# Patient Record
Sex: Female | Born: 1995 | Race: White | Hispanic: No | Marital: Married | Smoking: Never smoker
Health system: Southern US, Community
[De-identification: ages and names within clinical notes are randomized; demographics above are authoritative.]

## PROBLEM LIST (undated history)

## (undated) ENCOUNTER — Inpatient Hospital Stay (HOSPITAL_COMMUNITY): Payer: Self-pay

## (undated) DIAGNOSIS — K5900 Constipation, unspecified: Secondary | ICD-10-CM

## (undated) DIAGNOSIS — F319 Bipolar disorder, unspecified: Secondary | ICD-10-CM

## (undated) DIAGNOSIS — K529 Noninfective gastroenteritis and colitis, unspecified: Secondary | ICD-10-CM

## (undated) DIAGNOSIS — N83209 Unspecified ovarian cyst, unspecified side: Secondary | ICD-10-CM

## (undated) DIAGNOSIS — Z789 Other specified health status: Secondary | ICD-10-CM

## (undated) HISTORY — PX: TONSILLECTOMY: SUR1361

## (undated) HISTORY — DX: Unspecified ovarian cyst, unspecified side: N83.209

## (undated) HISTORY — DX: Noninfective gastroenteritis and colitis, unspecified: K52.9

---
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
## 2015-02-28 ENCOUNTER — Encounter (HOSPITAL_COMMUNITY): Payer: Self-pay | Admitting: *Deleted

## 2016-06-20 IMAGING — CR DG ABDOMEN 1V
1 series · 1 of 1 positions shown · non-contrast
Comparison: 11/04/2011

CLINICAL DATA: Constipation for 2 weeks. Nausea, vomiting, and
abdominal pain for 1 day.

EXAM:
ABDOMEN - 1 VIEW

[t abdomen supine]
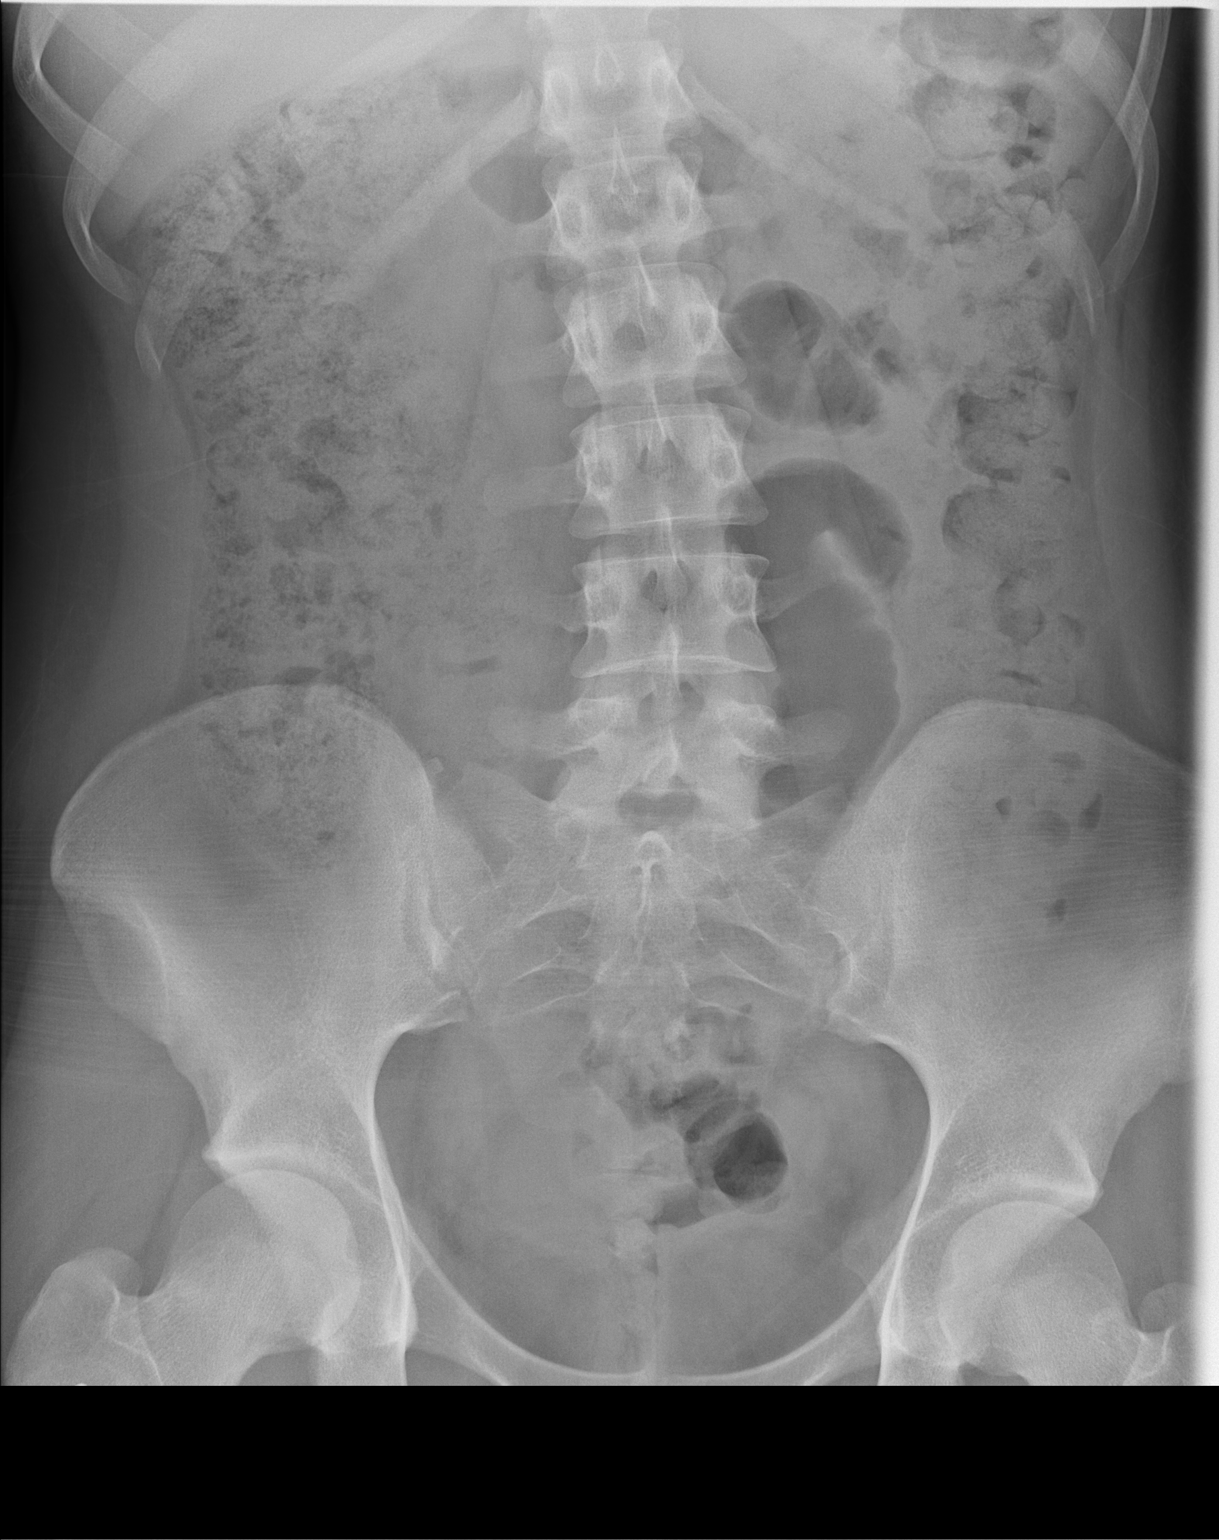

[1 of 1 positions shown; findings below may reference images not displayed]

FINDINGS: Diffusely stool-filled colon. No small or large bowel distention. No
radiopaque stones. Visualized bones appear intact.
IMPRESSION: Nonobstructive bowel gas pattern with stool-filled colon.

## 2017-05-30 ENCOUNTER — Encounter (HOSPITAL_COMMUNITY): Payer: Self-pay | Admitting: *Deleted

## 2017-05-30 ENCOUNTER — Inpatient Hospital Stay (HOSPITAL_COMMUNITY)
Admission: AD | Admit: 2017-05-30 | Discharge: 2017-05-30 | Disposition: A | Discharge status: Home / Self Care | Source: Ambulatory Visit | Attending: Obstetrics and Gynecology | Admitting: Obstetrics and Gynecology

## 2017-05-30 ENCOUNTER — Other Ambulatory Visit: Payer: Self-pay

## 2017-05-30 DIAGNOSIS — N912 Amenorrhea, unspecified: Secondary | ICD-10-CM | POA: Insufficient documentation

## 2017-05-30 DIAGNOSIS — Z711 Person with feared health complaint in whom no diagnosis is made: Secondary | ICD-10-CM | POA: Diagnosis not present

## 2017-05-30 DIAGNOSIS — Z3490 Encounter for supervision of normal pregnancy, unspecified, unspecified trimester: Secondary | ICD-10-CM

## 2017-05-30 NOTE — [facility] Provider Note (Signed)
[name] is a 22 y.o. G1P0000 who present to [facility] today for pregnancy confirmation. She denies abdominal pain or vaginal bleeding.   BP 116/61 (BP Location: Right Arm)   Pulse 80   Temp 98.5 F (36.9 C) (Oral)   Resp 16   Wt 137 lb 4 oz (62.3 kg)   LMP 04/27/2017   SpO2 100%   BMI 22.84 kg/m  CONSTITUTIONAL: Well-developed, well-nourished female in no acute distress.  CARDIOVASCULAR: Normal heart rate noted RESPIRATORY: Effort and breath sounds normal GASTROINTESTINAL:Soft, no distention noted.  No tenderness, rebound or guarding.  SKIN: Skin is warm and dry. No rash noted. Not diaphoretic. No erythema. No pallor. PSYCHIATRIC: Normal mood and affect. Normal behavior. Normal judgment and thought content.  MDM Medical screening exam complete Patient does not endorse any symptoms concerning for ectopic pregnancy or pregnancy related complication today.   A:  Amenorrhea  P: Discharge home Patient advised that she can present as a walk-in to [facility] for a pregnancy test M-Th between 8am-4pm or Friday between 8am -11am Reasons to return to [facility]U reviewed  Patient may return to [facility]U as needed or if her condition were to change or worsen  Montez Morita, CNM 05/30/2017 12:27 PM

## 2017-05-30 NOTE — MAU Note (Signed)
Just here to confirm preg.  3+HPT. Had some cramping yesterday, none today.

## 2017-07-02 ENCOUNTER — Encounter (HOSPITAL_COMMUNITY): Payer: Self-pay

## 2017-07-02 ENCOUNTER — Other Ambulatory Visit: Payer: Self-pay

## 2017-07-02 ENCOUNTER — Inpatient Hospital Stay (HOSPITAL_COMMUNITY)

## 2017-07-02 ENCOUNTER — Inpatient Hospital Stay (HOSPITAL_COMMUNITY)
Admission: AD | Admit: 2017-07-02 | Discharge: 2017-07-02 | Disposition: A | Discharge status: Home / Self Care | Attending: Obstetrics and Gynecology | Admitting: Obstetrics and Gynecology

## 2017-07-02 DIAGNOSIS — Z3A09 9 weeks gestation of pregnancy: Secondary | ICD-10-CM | POA: Insufficient documentation

## 2017-07-02 DIAGNOSIS — O219 Vomiting of pregnancy, unspecified: Secondary | ICD-10-CM

## 2017-07-02 DIAGNOSIS — O26891 Other specified pregnancy related conditions, first trimester: Secondary | ICD-10-CM | POA: Insufficient documentation

## 2017-07-02 DIAGNOSIS — O99341 Other mental disorders complicating pregnancy, first trimester: Secondary | ICD-10-CM | POA: Insufficient documentation

## 2017-07-02 DIAGNOSIS — R1033 Periumbilical pain: Secondary | ICD-10-CM | POA: Insufficient documentation

## 2017-07-02 DIAGNOSIS — R109 Unspecified abdominal pain: Secondary | ICD-10-CM | POA: Diagnosis not present

## 2017-07-02 DIAGNOSIS — F319 Bipolar disorder, unspecified: Secondary | ICD-10-CM | POA: Diagnosis not present

## 2017-07-02 DIAGNOSIS — O26899 Other specified pregnancy related conditions, unspecified trimester: Secondary | ICD-10-CM

## 2017-07-02 LAB — URINALYSIS, ROUTINE W REFLEX MICROSCOPIC
Bilirubin Urine: NEGATIVE
Glucose, UA: NEGATIVE mg/dL
HGB URINE DIPSTICK: NEGATIVE
Ketones, ur: NEGATIVE mg/dL
NITRITE: NEGATIVE
PROTEIN: NEGATIVE mg/dL
SPECIFIC GRAVITY, URINE: 1.011 (ref 1.005–1.030)
pH: 7 (ref 5.0–8.0)

## 2017-07-02 LAB — CBC
HEMATOCRIT: 36.5 % (ref 36.0–46.0)
Hemoglobin: 12.2 g/dL (ref 12.0–15.0)
MCH: 29.4 pg (ref 26.0–34.0)
MCHC: 33.4 g/dL (ref 30.0–36.0)
MCV: 88 fL (ref 78.0–100.0)
Platelets: 283 10*3/uL (ref 150–400)
RBC: 4.15 MIL/uL (ref 3.87–5.11)
RDW: 13.2 % (ref 11.5–15.5)
WBC: 11.5 10*3/uL — ABNORMAL HIGH (ref 4.0–10.5)

## 2017-07-02 LAB — WET PREP, GENITAL
Clue Cells Wet Prep HPF POC: NONE SEEN
Sperm: NONE SEEN
TRICH WET PREP: NONE SEEN
YEAST WET PREP: NONE SEEN

## 2017-07-02 LAB — RAPID URINE DRUG SCREEN, HOSP PERFORMED
Amphetamines: NOT DETECTED
Benzodiazepines: NOT DETECTED
Cocaine: NOT DETECTED
OPIATES: NOT DETECTED
TETRAHYDROCANNABINOL: NOT DETECTED

## 2017-07-02 LAB — HCG, QUANTITATIVE, PREGNANCY: HCG, BETA CHAIN, QUANT, S: 142195 m[IU]/mL — AB (ref <5)

## 2017-07-02 LAB — POCT PREGNANCY, URINE: Preg Test, Ur: POSITIVE — AB

## 2017-07-02 MED ORDER — ONDANSETRON 4 MG PO TBDP
4.0000 mg | ORAL_TABLET | Freq: Once | ORAL | Status: AC
  Administered 2017-07-02: 4 mg via ORAL
  Filled 2017-07-02: qty 1

## 2017-07-02 MED ORDER — DOXYLAMINE-PYRIDOXINE 10-10 MG PO TBEC: 2.0000 | DELAYED_RELEASE_TABLET | Freq: Every evening | ORAL | 2 refills | Status: DC | PRN

## 2017-07-02 MED ORDER — ONDANSETRON 4 MG PO TBDP: 4.0000 mg | ORAL_TABLET | Freq: Three times a day (TID) | ORAL | 0 refills | Status: DC | PRN

## 2017-07-02 NOTE — Discharge Instructions (Signed)
Abdominal Pain During Pregnancy °Abdominal pain is common in pregnancy. Most of the time, it does not cause harm. There are many causes of abdominal pain. Some causes are more serious than others and sometimes the cause is not known. Abdominal pain can be a sign that something is very wrong with the pregnancy or the pain may have nothing to do with the pregnancy. Always tell your health care provider if you have any abdominal pain. °Follow these instructions at home: °· Do not have sex or put anything in your vagina until your symptoms go away completely. °· Watch your abdominal pain for any changes. °· Get plenty of rest until your pain improves. °· Drink enough fluid to keep your urine clear or pale yellow. °· Take over-the-counter or prescription medicines only as told by your health care provider. °· Keep all follow-up visits as told by your health care provider. This is important. °Contact a health care provider if: °· You have a fever. °· Your pain gets worse or you have cramping. °· Your pain continues after resting. °Get help right away if: °· You are bleeding, leaking fluid, or passing tissue from the vagina. °· You have vomiting or diarrhea that does not go away. °· You have painful or bloody urination. °· You notice a decrease in your baby's movements. °· You feel very weak or faint. °· You have shortness of breath. °· You develop a severe headache with abdominal pain. °· You have abnormal vaginal discharge with abdominal pain. °This information is not intended to replace advice given to you by your health care provider. Make sure you discuss any questions you have with your health care provider. °Document Released: 12/23/2004 Document Revised: 10/04/2015 Document Reviewed: 07/22/2012 °Elsevier Interactive Patient Education © 2018 Elsevier Inc. ° °Morning Sickness °Morning sickness is when you feel sick to your stomach (nauseous) during pregnancy. This nauseous feeling may or may not come with vomiting.  It often occurs in the morning but can be a problem any time of day. Morning sickness is most common during the first trimester, but it may continue throughout pregnancy. While morning sickness is unpleasant, it is usually harmless unless you develop severe and continual vomiting (hyperemesis gravidarum). This condition requires more intense treatment. °What are the causes? °The cause of morning sickness is not completely known but seems to be related to normal hormonal changes that occur in pregnancy. °What increases the risk? °You are at greater risk if you: °· Experienced nausea or vomiting before your pregnancy. °· Had morning sickness during a previous pregnancy. °· Are pregnant with more than one baby, such as twins. ° °How is this treated? °Do not use any medicines (prescription, over-the-counter, or herbal) for morning sickness without first talking to your health care provider. Your health care provider may prescribe or recommend: °· Vitamin B6 supplements. °· Anti-nausea medicines. °· The herbal medicine ginger. ° °Follow these instructions at home: °· Only take over-the-counter or prescription medicines as directed by your health care provider. °· Taking multivitamins before getting pregnant can prevent or decrease the severity of morning sickness in most women. °· Eat a piece of dry toast or unsalted crackers before getting out of bed in the morning. °· Eat five or six small meals a day. °· Eat dry and bland foods (rice, baked potato). Foods high in carbohydrates are often helpful. °· Do not drink liquids with your meals. Drink liquids between meals. °· Avoid greasy, fatty, and spicy foods. °· Get someone to cook for   you if the smell of any food causes nausea and vomiting.  If you feel nauseous after taking prenatal vitamins, take the vitamins at night or with a snack.  Snack on protein foods (nuts, yogurt, cheese) between meals if you are hungry.  Eat unsweetened gelatins for desserts.  Wearing  an acupressure wristband (worn for sea sickness) may be helpful.  Acupuncture may be helpful.  Do not smoke.  Get a humidifier to keep the air in your house free of odors.  Get plenty of fresh air. Contact a health care provider if:  Your home remedies are not working, and you need medicine.  You feel dizzy or lightheaded.  You are losing weight. Get help right away if:  You have persistent and uncontrolled nausea and vomiting.  You pass out (faint). This information is not intended to replace advice given to you by your health care provider. Make sure you discuss any questions you have with your health care provider. Document Released: 02/13/2006 Document Revised: 05/31/2015 Document Reviewed: 06/09/2012 Elsevier Interactive Patient Education  2017 Elsevier Inc.  [place] [facility] Providers    [facility]       Phone: [phone]  [facility] [place] Phone: [phone]  [facility] [place]  Phone: [phone]  [facility]  Phone: [phone]  [facility] [place]  Phone: [phone]  [facility]       Phone: [phone]  [facility]    Phone: [phone]   [facility]    Phone: [phone]  [facility]    Phone: [phone]  [facility]    Phone: [phone]  [facility]    Phone: [phone]  [facility]       Phone: [phone]   [facility]  Phone: [phone]  [facility]    Phone: [phone]  [facility]   Phone: [phone]  [facility]      Phone: [phone]  [facility]    Phone: [phone]

## 2017-07-02 NOTE — MAU Provider Note (Signed)
Chief Complaint: Abdominal Pain   First Provider Initiated Contact with Patient 07/02/17 2044        SUBJECTIVE HPI: Kimberly [name] is a 22 y.o. G3P1001 at [redacted]w[redacted]d by LMP who presents to maternity admissions reporting lower abdominal pain for several hours.  Has also had nausea and vomiting. Several + UPTs at home.  . She denies vaginal bleeding, vaginal itching/burning, urinary symptoms, h/a, dizziness, or fever/chills.    Recently moved from [place] after husband got out of the service. Lives here now.   Abdominal Pain  This is a new problem. The current episode started today. The onset quality is gradual. The problem occurs intermittently. The problem has been unchanged. The pain is located in the periumbilical region. The quality of the pain is cramping. The abdominal pain does not radiate. Associated symptoms include nausea and vomiting. Pertinent negatives include no anorexia, constipation, diarrhea, dysuria or fever. Nothing aggravates the pain. The pain is relieved by nothing. She has tried nothing for the symptoms.   RN Note: Pt states that she is starting having abdominal pain around 1200. Pt states she also having nausea and vomiting.  Pt states she also having a headache. Rating 8/10 intermittent     Past Medical History:  Diagnosis Date  . Bipolar 1 disorder (HCC)   . Constipation   . Enteritis   . No pertinent past medical history   . Ovarian cyst    Past Surgical History:  Procedure Laterality Date  . TONSILLECTOMY     Social History   Socioeconomic History  . Marital status: Married    Spouse name: Not on file  . Number of children: Not on file  . Years of education: Not on file  . Highest education level: Not on file  Occupational History  . Not on file  Social Needs  . Financial resource strain: Not on file  . Food insecurity:    Worry: Not on file    Inability: Not on file  . Transportation needs:    Medical: Not on file    Non-medical: Not on file   Tobacco Use  . Smoking status: Never Smoker  . Smokeless tobacco: Never Used  Substance and Sexual Activity  . Alcohol use: No  . Drug use: No  . Sexual activity: Yes    Birth control/protection: None  Lifestyle  . Physical activity:    Days per week: Not on file    Minutes per session: Not on file  . Stress: Not on file  Relationships  . Social connections:    Talks on phone: Not on file    Gets together: Not on file    Attends religious service: Not on file    Active member of club or organization: Not on file    Attends meetings of clubs or organizations: Not on file    Relationship status: Not on file  . Intimate partner violence:    Fear of current or ex partner: Not on file    Emotionally abused: Not on file    Physically abused: Not on file    Forced sexual activity: Not on file  Other Topics Concern  . Not on file  Social History Narrative   10th grade   No current facility-administered medications on file prior to encounter.    Current Outpatient Medications on File Prior to Encounter  Medication Sig Dispense Refill  . hydrocortisone-pramoxine (PROCTOFOAM HC) rectal foam Place 1 applicator rectally 2 (two) times daily. 10 g 1  .  Prenatal Vit-Fe Fumarate-FA (PRENATAL MULTIVITAMIN) TABS tablet Take 1 tablet by mouth daily at 12 noon.     No Known Allergies  I have reviewed patient's Past Medical Hx, Surgical Hx, Family Hx, Social Hx, medications and allergies.   ROS:  Review of Systems  Constitutional: Negative for fever.  Gastrointestinal: Positive for abdominal pain, nausea and vomiting. Negative for anorexia, constipation and diarrhea.  Genitourinary: Negative for dysuria.   Review of Systems  Other systems negative   Physical Exam  Physical Exam Patient Vitals for the past 24 hrs:  BP Temp Pulse Resp SpO2 Weight  07/02/17 2016 108/73 98.1 F (36.7 C) 77 16 100 % -  07/02/17 2009 - - - - - 145 lb 3 oz (65.9 kg)   Constitutional: Well-developed,  well-nourished female in no acute distress.  Cardiovascular: normal rate Respiratory: normal effort GI: Abd soft, non-tender. Pos BS x 4 MS: Extremities nontender, no edema, normal ROM Neurologic: Alert and oriented x 4.  GU: Neg CVAT.  PELVIC EXAM: Cervix pink, visually closed, without lesion, scant white creamy discharge, vaginal walls and external genitalia normal Bimanual exam: Cervix 0/long/high, firm, anterior, neg CMT, uterus nontender, nonenlarged, adnexa without tenderness, enlargement, or mass   LAB RESULTS Results for orders placed or performed during the hospital encounter of 07/02/17 (from the past 24 hour(s))  Urinalysis, Routine w reflex microscopic     Status: Abnormal   Collection Time: 07/02/17  8:11 PM  Result Value Ref Range   Color, Urine YELLOW YELLOW   APPearance HAZY (A) CLEAR   Specific Gravity, Urine 1.011 1.005 - 1.030   pH 7.0 5.0 - 8.0   Glucose, UA NEGATIVE NEGATIVE mg/dL   Hgb urine dipstick NEGATIVE NEGATIVE   Bilirubin Urine NEGATIVE NEGATIVE   Ketones, ur NEGATIVE NEGATIVE mg/dL   Protein, ur NEGATIVE NEGATIVE mg/dL   Nitrite NEGATIVE NEGATIVE   Leukocytes, UA TRACE (A) NEGATIVE   RBC / HPF 0-5 0 - 5 RBC/hpf   WBC, UA 0-5 0 - 5 WBC/hpf   Bacteria, UA RARE (A) NONE SEEN   Squamous Epithelial / LPF 0-5 0 - 5  Pregnancy, urine POC     Status: Abnormal   Collection Time: 07/02/17  8:36 PM  Result Value Ref Range   Preg Test, Ur POSITIVE (A) NEGATIVE  Urine rapid drug screen (hosp performed)     Status: Abnormal   Collection Time: 07/02/17  8:39 PM  Result Value Ref Range   Opiates NONE DETECTED NONE DETECTED   Cocaine NONE DETECTED NONE DETECTED   Benzodiazepines NONE DETECTED NONE DETECTED   Amphetamines NONE DETECTED NONE DETECTED   Tetrahydrocannabinol NONE DETECTED NONE DETECTED   Barbiturates (A) NONE DETECTED    Result not available. Reagent lot number recalled by manufacturer.  CBC     Status: Abnormal   Collection Time: 07/02/17   8:56 PM  Result Value Ref Range   WBC 11.5 (H) 4.0 - 10.5 K/uL   RBC 4.15 3.87 - 5.11 MIL/uL   Hemoglobin 12.2 12.0 - 15.0 g/dL   HCT 16.1 09.6 - 04.5 %   MCV 88.0 78.0 - 100.0 fL   MCH 29.4 26.0 - 34.0 pg   MCHC 33.4 30.0 - 36.0 g/dL   RDW 40.9 81.1 - 91.4 %   Platelets 283 150 - 400 K/uL  hCG, quantitative, pregnancy     Status: Abnormal   Collection Time: 07/02/17  8:56 PM  Result Value Ref Range   hCG, Beta Chain, Quant, S  142,195 (H) <5 mIU/mL  Wet prep, genital     Status: Abnormal   Collection Time: 07/02/17  9:11 PM  Result Value Ref Range   Yeast Wet Prep HPF POC NONE SEEN NONE SEEN   Trich, Wet Prep NONE SEEN NONE SEEN   Clue Cells Wet Prep HPF POC NONE SEEN NONE SEEN   WBC, Wet Prep HPF POC FEW (A) NONE SEEN   Sperm NONE SEEN       IMAGING [place] Ob Comp Less 14 Wks  Result Date: 07/02/2017 CLINICAL DATA:  22 year old pregnant female presenting with abdominal pain. LMP: 04/27/2017 corresponding to an estimated gestational age of [redacted] weeks, 3 days EXAM: OBSTETRIC <14 WK ULTRASOUND TECHNIQUE: Transabdominal ultrasound was performed for evaluation of the gestation as well as the maternal uterus and adnexal regions. COMPARISON:  None. FINDINGS: Intrauterine gestational sac: Single intrauterine gestational sac. Yolk sac:  Seen Embryo:  Present Cardiac Activity: Detected Heart Rate: 170 bpm CRL:   27 mm   9 w 4 d                  US EDC: 01/31/2018 Subchorionic hemorrhage: Small subchorionic hemorrhage measuring 2.2 x 3.6 x 1.2 cm. Maternal uterus/adnexae: The maternal ovaries are unremarkable. There is a 4.9 x 3.0 x 3.8 cm corpus luteum cyst in the right ovary. IMPRESSION: 1. Single live intrauterine pregnancy with an estimated gestational age of [redacted] weeks, 4 days based on today's ultrasound, concordant with age based on LMP. 2. Small subchorionic hemorrhage. Electronically Signed   By: Elgie CollardArash  Radparvar M.D.   On: 07/02/2017 22:17     MAU Management/MDM: Ordered usual first trimester  r/o ectopic labs.   Pelvic exam and cultures done Will check baseline Ultrasound to rule out ectopic.  This bleeding/pain can represent a normal pregnancy with bleeding, spontaneous abortion or even an ectopic which can be life-threatening.  The process as listed above helps to determine which of these is present.  Reviewed findings with patient.  Live IUP at [id number] List of providers given for prenatal care Zofran given for vomiting Will rx Diclegis with Zofran for backup  ASSESSMENT Single IUP at 4670w4d Periumbilical pain, uncertain etiology Pregnancy of unknown location >> deemed to be intrauterine Nausea and vomiting  PLAN Discharge home List of OB providers given Rx Diclegis for N/V Rx Zofran as backup if Diclegis fails [facility]  Pt stable at time of discharge. Encouraged to return here or to other Urgent Care/ED if she develops worsening of symptoms, increase in pain, fever, or other concerning symptoms.    Wynelle BourgeoisMarie Zaiden Ludlum CNM, MSN Certified Nurse-Midwife 07/02/2017  8:44 PM

## 2017-07-02 NOTE — MAU Note (Signed)
Pt states that she is starting having abdominal pain around 1200.  Pt states she also having nausea and vomiting.   Pt states she also having a headache. Rating 8/10 intermittent

## 2017-07-03 ENCOUNTER — Emergency Department (HOSPITAL_COMMUNITY)
Admission: EM | Admit: 2017-07-03 | Discharge: 2017-07-03 | Disposition: A | Discharge status: Home / Self Care | Attending: Emergency Medicine | Admitting: Emergency Medicine

## 2017-07-03 ENCOUNTER — Emergency Department (HOSPITAL_COMMUNITY)

## 2017-07-03 ENCOUNTER — Other Ambulatory Visit: Payer: Self-pay

## 2017-07-03 ENCOUNTER — Encounter (HOSPITAL_COMMUNITY): Payer: Self-pay | Admitting: Emergency Medicine

## 2017-07-03 DIAGNOSIS — Z79899 Other long term (current) drug therapy: Secondary | ICD-10-CM | POA: Insufficient documentation

## 2017-07-03 DIAGNOSIS — R1013 Epigastric pain: Secondary | ICD-10-CM

## 2017-07-03 DIAGNOSIS — R112 Nausea with vomiting, unspecified: Secondary | ICD-10-CM | POA: Insufficient documentation

## 2017-07-03 DIAGNOSIS — R1115 Cyclical vomiting syndrome unrelated to migraine: Secondary | ICD-10-CM

## 2017-07-03 DIAGNOSIS — R10811 Right upper quadrant abdominal tenderness: Secondary | ICD-10-CM

## 2017-07-03 DIAGNOSIS — R109 Unspecified abdominal pain: Secondary | ICD-10-CM | POA: Diagnosis present

## 2017-07-03 LAB — CBC WITH DIFFERENTIAL/PLATELET
Abs Immature Granulocytes: 0 10*3/uL (ref 0.0–0.1)
Basophils Absolute: 0 10*3/uL (ref 0.0–0.1)
Basophils Relative: 0 %
EOS ABS: 0 10*3/uL (ref 0.0–0.7)
Eosinophils Relative: 0 %
HEMATOCRIT: 39.2 % (ref 36.0–46.0)
HEMOGLOBIN: 12.6 g/dL (ref 12.0–15.0)
Immature Granulocytes: 0 %
LYMPHS ABS: 0.9 10*3/uL (ref 0.7–4.0)
Lymphocytes Relative: 7 %
MCH: 28.9 pg (ref 26.0–34.0)
MCHC: 32.1 g/dL (ref 30.0–36.0)
MCV: 89.9 fL (ref 78.0–100.0)
Monocytes Absolute: 0.7 10*3/uL (ref 0.1–1.0)
Monocytes Relative: 6 %
NEUTROS ABS: 11.4 10*3/uL — AB (ref 1.7–7.7)
NEUTROS PCT: 87 %
Platelets: 282 10*3/uL (ref 150–400)
RBC: 4.36 MIL/uL (ref 3.87–5.11)
RDW: 12.8 % (ref 11.5–15.5)
WBC: 13.1 10*3/uL — AB (ref 4.0–10.5)

## 2017-07-03 LAB — COMPREHENSIVE METABOLIC PANEL
ALBUMIN: 3.5 g/dL (ref 3.5–5.0)
ALT: 10 U/L (ref 0–44)
ANION GAP: 8 (ref 5–15)
AST: 14 U/L — ABNORMAL LOW (ref 15–41)
Alkaline Phosphatase: 52 U/L (ref 38–126)
BUN: <5 mg/dL — ABNORMAL LOW (ref 6–20)
CALCIUM: 9 mg/dL (ref 8.9–10.3)
CHLORIDE: 105 mmol/L (ref 98–111)
CO2: 23 mmol/L (ref 22–32)
Creatinine, Ser: 0.54 mg/dL (ref 0.44–1.00)
GFR calc non Af Amer: >60 mL/min (ref >60)
GLUCOSE: 100 mg/dL — AB (ref 70–99)
Potassium: 3.6 mmol/L (ref 3.5–5.1)
SODIUM: 136 mmol/L (ref 135–145)
Total Bilirubin: 0.8 mg/dL (ref 0.3–1.2)
Total Protein: 6.6 g/dL (ref 6.5–8.1)

## 2017-07-03 LAB — HIV ANTIBODY (ROUTINE TESTING W REFLEX): HIV SCREEN 4TH GENERATION: NONREACTIVE

## 2017-07-03 LAB — GC/CHLAMYDIA PROBE AMP (~~LOC~~) NOT AT ARMC
CHLAMYDIA, DNA PROBE: POSITIVE — AB
NEISSERIA GONORRHEA: NEGATIVE

## 2017-07-03 LAB — LIPASE, BLOOD: Lipase: 24 U/L (ref 11–51)

## 2017-07-03 MED ORDER — ACETAMINOPHEN 325 MG PO TABS
650.0000 mg | ORAL_TABLET | Freq: Once | ORAL | Status: AC
  Administered 2017-07-03: 650 mg via ORAL
  Filled 2017-07-03: qty 2

## 2017-07-03 MED ORDER — SODIUM CHLORIDE 0.9 % IV BOLUS
1000.0000 mL | Freq: Once | INTRAVENOUS | Status: AC
  Administered 2017-07-03: 1000 mL via INTRAVENOUS

## 2017-07-03 MED ORDER — PYRIDOXINE HCL 25 MG PO TABS: 25.0000 mg | ORAL_TABLET | Freq: Every day | ORAL | 0 refills | Status: DC

## 2017-07-03 MED ORDER — METOCLOPRAMIDE HCL 5 MG/ML IJ SOLN
10.0000 mg | Freq: Once | INTRAMUSCULAR | Status: AC
  Administered 2017-07-03: 10 mg via INTRAVENOUS
  Filled 2017-07-03: qty 2

## 2017-07-03 NOTE — ED Notes (Signed)
Patient tolerating oral intake 

## 2017-07-03 NOTE — ED Notes (Signed)
Patient transported to Ultrasound 

## 2017-07-03 NOTE — ED Provider Notes (Signed)
[facility] EMERGENCY DEPARTMENT Provider Note   CSN: [id number] Arrival date & time: 07/03/17  4098     History   Chief Complaint Chief Complaint  Patient presents with  . Abdominal Pain    HPI Kimberly [name] is a 22 y.o. female.  HPI   Kimberly [name] is a 22 y.o. female, with a history of bipolar and G2P1, presenting to the ED with abdominal pain beginning yesterday. Pain is just above umbilicus, waxing and waning, cramping, 7/10, nonradiating. Accompanied by nausea and vomiting.   Was seen at [facility] hospital yesterday where she had labs and US performed. Discharged with prescription for diclegis and Zofran, but hasn't filled it. Was told she can take tylenol, but has not done this.   Last BM yesterday.  Denies fever/chills, vaginal bleeding, abnormal vagina discharge, diarrhea, urinary symptoms, or any other complaints.      Past Medical History:  Diagnosis Date  . Bipolar 1 disorder (HCC)   . Constipation   . Enteritis   . No pertinent past medical history   . Ovarian cyst     Patient Active Problem List   Diagnosis Date Noted  . Abnormal quad screen   . Supervision of normal pregnancy 07/05/2014  . Bipolar 1 disorder (HCC) 07/05/2014  . Chronic constipation 11/11/2011  . Periumbilical abdominal pain   . Ovarian cyst     Past Surgical History:  Procedure Laterality Date  . TONSILLECTOMY       OB History    Gravida  2   Para  1   Term  1   Preterm  0   AB  0   Living  1     SAB  0   TAB  0   Ectopic  0   Multiple  0   Live Births  1            Home Medications    Prior to Admission medications   Medication Sig Start Date End Date Taking? Authorizing Provider  acetaminophen (TYLENOL) 500 MG tablet Take 500 mg by mouth every 6 (six) hours as needed for mild pain.   Yes [provider]  Prenatal Vit-Fe Fumarate-FA (PRENATAL MULTIVITAMIN) TABS tablet Take 1 tablet by mouth daily at 12 noon.   Yes  [provider]  Doxylamine-Pyridoxine (DICLEGIS) 10-10 MG TBEC Take 2 tablets by mouth at bedtime as needed. Take 2 tabs at bedtime. If needed, add 1 tab in the AM day 3. May add 1 tab in PM on day 4. 07/02/17   Aviva Signs, CNM  hydrocortisone-pramoxine (PROCTOFOAM [facility]) rectal foam Place 1 applicator rectally 2 (two) times daily. Patient not taking: Reported on 07/03/2017 09/27/14   Katrinka Blazing, [place], CNM  ondansetron (ZOFRAN-ODT) 4 MG disintegrating tablet Take 1 tablet (4 mg total) by mouth every 8 (eight) hours as needed for nausea or vomiting. 07/02/17   Aviva Signs, CNM    Family History Family History  Problem Relation Age of Onset  . Asthma Brother   . Hypertension Mother   . Diabetes Paternal Uncle   . Diabetes Maternal Grandmother   . Cancer Maternal Grandmother   . Diabetes Maternal Grandfather   . Cancer Maternal Grandfather   . Stroke Maternal Grandfather   . Hirschsprung's disease Neg Hx     Social History Social History   Tobacco Use  . Smoking status: Never Smoker  . Smokeless tobacco: Never Used  Substance Use Topics  . Alcohol use: No  .  Drug use: No     Allergies   Patient has no known allergies.   Review of Systems Review of Systems  Constitutional: Negative for chills, diaphoresis and fever.  Respiratory: Negative for shortness of breath.   Cardiovascular: Negative for chest pain.  Gastrointestinal: Positive for abdominal pain, nausea and vomiting. Negative for diarrhea.  Genitourinary: Negative for dysuria and hematuria.  Musculoskeletal: Negative for back pain.  All other systems reviewed and are negative.    Physical Exam Updated Vital Signs BP 120/79 (BP Location: Right Arm)   Pulse 87   Temp 97.9 F (36.6 C) (Oral)   Resp 20   LMP 04/27/2017   SpO2 99%   Physical Exam  Constitutional: She appears well-developed and well-nourished. No distress.  HENT:  Head: Normocephalic and atraumatic.  Eyes: Conjunctivae are  normal.  Neck: Neck supple.  Cardiovascular: Normal rate, regular rhythm, normal heart sounds and intact distal pulses.  Pulmonary/Chest: Effort normal and breath sounds normal. No respiratory distress.  Abdominal: Soft. Bowel sounds are normal. There is tenderness in the right upper quadrant and epigastric area. There is no guarding.    Musculoskeletal: She exhibits no edema.  Lymphadenopathy:    She has no cervical adenopathy.  Neurological: She is alert.  Skin: Skin is warm and dry. She is not diaphoretic.  Psychiatric: She has a normal mood and affect. Her behavior is normal.  Nursing note and vitals reviewed.    ED Treatments / Results  Labs (all labs ordered are listed, but only abnormal results are displayed) Labs Reviewed  COMPREHENSIVE METABOLIC PANEL - Abnormal; Notable for the following components:      Result Value   Glucose, Bld 100 (*)    BUN <5 (*)    AST 14 (*)    All other components within normal limits  CBC WITH DIFFERENTIAL/PLATELET - Abnormal; Notable for the following components:   WBC 13.1 (*)    Neutro Abs 11.4 (*)    All other components within normal limits  LIPASE, BLOOD    EKG None  Radiology [place] Ob Comp Less 14 Wks  Result Date: 07/02/2017 CLINICAL DATA:  22 year old pregnant female presenting with abdominal pain. LMP: 04/27/2017 corresponding to an estimated gestational age of [redacted] weeks, 3 days EXAM: OBSTETRIC <14 WK ULTRASOUND TECHNIQUE: Transabdominal ultrasound was performed for evaluation of the gestation as well as the maternal uterus and adnexal regions. COMPARISON:  None. FINDINGS: Intrauterine gestational sac: Single intrauterine gestational sac. Yolk sac:  Seen Embryo:  Present Cardiac Activity: Detected Heart Rate: 170 bpm CRL:   27 mm   9 w 4 d                  US EDC: 01/31/2018 Subchorionic hemorrhage: Small subchorionic hemorrhage measuring 2.2 x 3.6 x 1.2 cm. Maternal uterus/adnexae: The maternal ovaries are unremarkable. There is a  4.9 x 3.0 x 3.8 cm corpus luteum cyst in the right ovary. IMPRESSION: 1. Single live intrauterine pregnancy with an estimated gestational age of [redacted] weeks, 4 days based on today's ultrasound, concordant with age based on LMP. 2. Small subchorionic hemorrhage. Electronically Signed   By: Elgie CollardArash  Radparvar M.D.   On: 07/02/2017 22:17   [place] Abdomen Limited Ruq  Result Date: 07/03/2017 CLINICAL DATA:  Epigastric pain EXAM: ULTRASOUND ABDOMEN LIMITED RIGHT UPPER QUADRANT COMPARISON:  CT 06/20/2011 FINDINGS: Gallbladder: No gallstones or wall thickening visualized. A 5 mm gallbladder wall polyp. No sonographic Murphy sign noted by sonographer. Common bile duct: Diameter: 3.3 mm,  unremarkable Liver: 2.1 x 1.5 cm echogenic lesion in the posterior right lobe, not conspicuous on prior CT. Liver otherwise unremarkable. No biliary ductal dilatation. Portal vein is patent on color Doppler imaging with normal direction of blood flow towards the liver. IMPRESSION: 1. No gallstones or ultrasound evidence of cholecystitis. 2. 2.1 cm posterior right echogenic liver lesion, statistically most likely benign hemangioma in the absence of a history of primary carcinoma, but nonspecific. Consider elective outpatient MR liver with and without contrast for definitive characterization, to exclude neoplasm. Electronically Signed   By: Corlis Leak M.D.   On: 07/03/2017 10:07    Procedures Procedures (including critical care time)  Medications Ordered in ED Medications  sodium chloride 0.9 % bolus 1,000 mL (1,000 mLs Intravenous New Bag/Given 07/03/17 0918)  metoCLOPramide (REGLAN) injection 10 mg (10 mg Intravenous Given 07/03/17 0918)  acetaminophen (TYLENOL) tablet 650 mg (650 mg Oral Given 07/03/17 4098)     Initial Impression / Assessment and Plan / ED Course  I have reviewed the triage vital signs and the nursing notes.  Pertinent labs & imaging results that were available during my care of the patient were reviewed by me and  considered in my medical decision making (see chart for details).  Clinical Course as of Jul 04 1051  Fri Jul 03, 2017  1044 Patient states her pain pain and nausea have resolved.  No vomiting during ED course.    [SJ]    Clinical Course User Index [SJ] Joy, Shawn C, PA-C    Patient presents with cramping abdominal pain, nausea, and vomiting. Patient is nontoxic appearing, afebrile, not tachycardic, not tachypneic, not hypotensive, maintains excellent SPO2 on room air, and is in no apparent distress.  Mild leukocytosis.  No vomiting during ED course.  Pain resolved with Tylenol.  Discussed abnormalities noted on right upper quadrant ultrasound as well as recommendation for additional outpatient imaging.   Vitals:   07/03/17 0745 07/03/17 0815 07/03/17 0830 07/03/17 1047  BP: 120/79 113/67 111/67 103/74  Pulse: 87 80 84 79  Resp: 20     Temp: 97.9 F (36.6 C)     TempSrc: Oral     SpO2: 99% 99% 97% 99%     Final Clinical Impressions(s) / ED Diagnoses   Final diagnoses:  Epigastric pain  RUQ abdominal tenderness  Non-intractable cyclical vomiting with nausea    ED Discharge Orders    None       Concepcion Living 07/03/17 1053    Vanetta Mulders, MD 07/06/17 2212

## 2017-07-03 NOTE — Discharge Instructions (Addendum)
Nausea and vomiting  Hand washing: Wash your hands throughout the day, but especially before and after touching the face, using the restroom, sneezing, coughing, or touching surfaces that have been coughed or sneezed upon. Hydration: Symptoms will be intensified and complicated by dehydration. Dehydration can also extend the duration of symptoms. Drink plenty of fluids and get plenty of rest. You should be drinking at least half a liter of water an hour to stay hydrated. Electrolyte drinks (ex. Gatorade, Powerade, Pedialyte) are also encouraged. You should be drinking enough fluids to make your urine light yellow, almost clear. If this is not the case, you are not drinking enough water. Please note that some of the treatments indicated below will not be effective if you are not adequately hydrated. Diet: Please concentrate on hydration, however, you may introduce food slowly.  Start with a clear liquid diet, progressed to a full liquid diet, and then bland solids as you are able. Pain or fever: Tylenol for pain or fever.  Nausea/vomiting: Prefer that you use the diclegis for nausea/vomiting.  If the diclegis is too expensive, you may use vitamin b6 (pyridoxine) and doxylamine 10 mg (contained in some versions of Unisom). Follow-up: Follow-up with a primary care provider or OBGYN Return: Return should you develop a fever, bloody diarrhea, increased abdominal pain, uncontrolled vomiting, or any other major concerns.  There was a small liver abnormality noted on ultrasound.  This should be followed up with a MRI of the liver with and without contrast.  This can be set up through a primary care provider or perhaps through OB/GYN.

## 2017-07-03 NOTE — ED Notes (Signed)
[facility]

## 2017-07-03 NOTE — ED Triage Notes (Signed)
Pt reports being [redacted] weeks pregnant, has pain around umbilicus since yesterday, c/o n/v, no diarrhea, [facility], was told everything looked fine with the baby but pt states she did not get anything for her abd pain.

## 2017-07-08 ENCOUNTER — Other Ambulatory Visit: Payer: Self-pay | Admitting: Advanced Practice Midwife

## 2017-07-08 DIAGNOSIS — A749 Chlamydial infection, unspecified: Secondary | ICD-10-CM

## 2017-07-08 DIAGNOSIS — O98819 Other maternal infectious and parasitic diseases complicating pregnancy, unspecified trimester: Secondary | ICD-10-CM

## 2017-07-08 DIAGNOSIS — O98811 Other maternal infectious and parasitic diseases complicating pregnancy, first trimester: Principal | ICD-10-CM

## 2017-07-08 MED ORDER — AZITHROMYCIN 500 MG PO TABS: 1000.0000 mg | ORAL_TABLET | Freq: Once | ORAL | 0 refills | Status: AC

## 2017-07-08 NOTE — Progress Notes (Signed)
Chlamydia positive from 07/02/17 MAU visit Rx sent for Zithromax 1gm po  Will send message to clinic to notify her, since I am reviewing this on overnight shift.  Aviva SignsWilliams, Elyan Vanwieren L, CNM .

## 2017-07-10 ENCOUNTER — Encounter: Payer: Self-pay | Admitting: General Practice

## 2017-08-10 ENCOUNTER — Other Ambulatory Visit (HOSPITAL_COMMUNITY): Admission: RE | Admit: 2017-08-10 | Payer: Medicaid Other | Source: Ambulatory Visit

## 2017-08-10 ENCOUNTER — Encounter: Payer: Self-pay | Admitting: General Practice

## 2017-08-10 ENCOUNTER — Ambulatory Visit (INDEPENDENT_AMBULATORY_CARE_PROVIDER_SITE_OTHER): Admitting: Advanced Practice Midwife

## 2017-08-10 ENCOUNTER — Encounter: Payer: Self-pay | Admitting: Family Medicine

## 2017-08-10 ENCOUNTER — Ambulatory Visit: Admitting: Clinical

## 2017-08-10 ENCOUNTER — Encounter: Payer: Self-pay | Admitting: Advanced Practice Midwife

## 2017-08-10 VITALS — BP 115/58 | HR 75 | Wt 146.0 lb

## 2017-08-10 DIAGNOSIS — Z113 Encounter for screening for infections with a predominantly sexual mode of transmission: Secondary | ICD-10-CM

## 2017-08-10 DIAGNOSIS — O98811 Other maternal infectious and parasitic diseases complicating pregnancy, first trimester: Secondary | ICD-10-CM

## 2017-08-10 DIAGNOSIS — Z124 Encounter for screening for malignant neoplasm of cervix: Secondary | ICD-10-CM

## 2017-08-10 DIAGNOSIS — Z3482 Encounter for supervision of other normal pregnancy, second trimester: Secondary | ICD-10-CM

## 2017-08-10 DIAGNOSIS — O98812 Other maternal infectious and parasitic diseases complicating pregnancy, second trimester: Secondary | ICD-10-CM

## 2017-08-10 DIAGNOSIS — Z1151 Encounter for screening for human papillomavirus (HPV): Secondary | ICD-10-CM | POA: Diagnosis not present

## 2017-08-10 DIAGNOSIS — Z3480 Encounter for supervision of other normal pregnancy, unspecified trimester: Secondary | ICD-10-CM | POA: Insufficient documentation

## 2017-08-10 DIAGNOSIS — A749 Chlamydial infection, unspecified: Secondary | ICD-10-CM

## 2017-08-10 LAB — POCT URINALYSIS DIP (DEVICE)
Bilirubin Urine: NEGATIVE
Glucose, UA: NEGATIVE mg/dL
HGB URINE DIPSTICK: NEGATIVE
Nitrite: NEGATIVE
PH: 6.5 (ref 5.0–8.0)
Protein, ur: NEGATIVE mg/dL
SPECIFIC GRAVITY, URINE: 1.02 (ref 1.005–1.030)
Urobilinogen, UA: 1 mg/dL (ref 0.0–1.0)

## 2017-08-10 NOTE — Progress Notes (Signed)
Subjective:   Kimberly [name] is a 22 y.o. G2P1001 at [redacted]w[redacted]d by early ultrasound being seen today for her first obstetrical visit.  Her obstetrical history is uncomplicated: NSVD at 39 weeks after SOL, newborn weight 6lb 10oz. Patient does intend to breast feed. Pregnancy history fully reviewed.  Patient reports no complaints.  HISTORY: OB History  Gravida Para Term Preterm AB Living  2 1 1  0 0 1  SAB TAB Ectopic Multiple Live Births  0 0 0 0 1    # Outcome Date GA Lbr Len/2nd Weight Sex Delivery Anes PTL Lv  2 Current           1 Term 2016 [redacted]w[redacted]d  6 lb 10 oz (3.005 kg) F Vag-Spont EPI  LIV    Last pap smear was done 2016 and was normal  Past Medical History:  Diagnosis Date  . Bipolar 1 disorder (HCC)   . Constipation   . Enteritis   . No pertinent past medical history   . Ovarian cyst    Past Surgical History:  Procedure Laterality Date  . TONSILLECTOMY     Family History  Problem Relation Age of Onset  . Asthma Brother   . Hypertension Mother   . Diabetes Paternal Uncle   . Diabetes Maternal Grandmother   . Cancer Maternal Grandmother   . Diabetes Maternal Grandfather   . Cancer Maternal Grandfather   . Stroke Maternal Grandfather   . Hirschsprung's disease Neg Hx    Social History   Tobacco Use  . Smoking status: Never Smoker  . Smokeless tobacco: Never Used  Substance Use Topics  . Alcohol use: No  . Drug use: No   No Known Allergies Current Outpatient Medications on File Prior to Visit  Medication Sig Dispense Refill  . Prenatal Vit-Fe Fumarate-FA (PRENATAL MULTIVITAMIN) TABS tablet Take 1 tablet by mouth daily at 12 noon.    Marland Kitchen acetaminophen (TYLENOL) 500 MG tablet Take 500 mg by mouth every 6 (six) hours as needed for mild pain.    . Doxylamine-Pyridoxine (DICLEGIS) 10-10 MG TBEC Take 2 tablets by mouth at bedtime as needed. Take 2 tabs at bedtime. If needed, add 1 tab in the AM day 3. May add 1 tab in PM on day 4. (Patient not taking: Reported on  08/10/2017) 100 tablet 2  . hydrocortisone-pramoxine (PROCTOFOAM HC) rectal foam Place 1 applicator rectally 2 (two) times daily. (Patient not taking: Reported on 07/03/2017) 10 g 1  . ondansetron (ZOFRAN-ODT) 4 MG disintegrating tablet Take 1 tablet (4 mg total) by mouth every 8 (eight) hours as needed for nausea or vomiting. (Patient not taking: Reported on 08/10/2017) 20 tablet 0  . pyridOXINE (VITAMIN B-6) 25 MG tablet Take 1 tablet (25 mg total) by mouth daily. (Patient not taking: Reported on 08/10/2017) 20 tablet 0   No current facility-administered medications on file prior to visit.     Review of Systems Pertinent items noted in HPI and remainder of comprehensive ROS otherwise negative.  Exam   Vitals:   08/10/17 1319  BP: (!) 115/58  Pulse: 75  Weight: 146 lb (66.2 kg)   Fetal Heart Rate (bpm): 154  Uterus:     Pelvic Exam: Perineum: no hemorrhoids, normal perineum   Vulva: normal external genitalia, no lesions   Vagina:  normal mucosa, normal discharge   Cervix: no lesions and normal, pap smear done.    Adnexa: normal adnexa and no mass, fullness, tenderness   Bony Pelvis: average  System: General: well-developed, well-nourished female in no acute distress   Breast:  normal appearance, no masses or tenderness   Skin: normal coloration and turgor, no rashes   Neurologic: oriented, normal, negative, normal mood   Extremities: normal strength, tone, and muscle mass, ROM of all joints is normal   HEENT PERRLA, extraocular movement intact and sclera clear, anicteric   Mouth/Teeth mucous membranes moist, pharynx normal without lesions and dental hygiene good   Neck supple and no masses   Cardiovascular: regular rate and rhythm   Respiratory:  no respiratory distress, normal breath sounds   Abdomen: soft, non-tender; bowel sounds normal; no masses,  no organomegaly     Assessment:   Pregnancy: G2P1001 Patient Active Problem List   Diagnosis Date Noted  . Supervision of  other normal pregnancy, antepartum 08/10/2017  . Chlamydia infection affecting pregnancy 07/08/2017  . Abnormal quad screen   . Bipolar 1 disorder (HCC) 07/05/2014  . Chronic constipation 11/11/2011  . Periumbilical abdominal pain   . Ovarian cyst      Plan:  1. Encounter for supervision of other normal pregnancy in second trimester --Feeling well today, no complaints, continue routine care --Meeting with Hulda MarinJamie McMannes, LCSW today - Prenatal Profile I - Culture, OB Urine - US MFM OB COMP + 14 WK; Future - Cytology - PAP - Cervicovaginal ancillary only  2. Chlamydia affecting pregnancy - Diagnosed in [facility]U 07/03 - Verbalizes she took medication as prescribed - Wet prep collected today  Patient left appointment without providing urine sample  Initial labs drawn. Continue prenatal vitamins (rx declined by patient). Genetic Screening discussed, declined due to out of pocket costs under [employer] Ultrasound discussed; fetal anatomic survey: ordered. Problem list reviewed and updated. The nature [facility] with multiple MDs and other Advanced Practice Providers was explained to patient; also emphasized that residents, students are part of our team. Routine obstetric precautions reviewed. Return in about 1 month (around 09/07/2017).  Clayton BiblesSamantha Genecis Veley, CNM 08/10/17 1:51 PM

## 2017-08-10 NOTE — BH Specialist Note (Signed)
Integrated Behavioral Health Initial Visit  MRN: [id number] Name: Kimberly [name]  Number of Integrated Behavioral Health Clinician visits:: 1/6 Session Start time: 1:54  Session End time: 2:01 Total time: 7 minutes  Type of Service: Integrated Behavioral Health- Individual/Family Interpretor:No. Interpretor Name and Language: n/a   Warm Hand Off Completed.       SUBJECTIVE: Kimberly [name] is a 22 y.o. female accompanied by n/a Patient was referred by Clayton BiblesSamantha Weinhold, CNM for initial OB introduction to integrated behavioral health services. Patient reports the following symptoms/concerns: Pt states no particular concerns today; looking forward to giving birth in hospital rather than [facility] this time.  Duration of problem: n/a; Severity of problem: n/a  OBJECTIVE: Mood: Appropriate and Affect: Appropriate Risk of harm to self or others: No plan to harm self or others  LIFE CONTEXT: Family and Social: Pt lives with her husband and 3yo daughter School/Work: - Self-Care: - Life Changes: Current pregnancy  GOALS ADDRESSED: Patient will: INTERVENTIONS: I Standardized Assessments completed: GAD-7 and PHQ 9  ASSESSMENT: Patient currently experiencing Supervision of normal pregnancy, antepartum   Patient may benefit from initial OB introduction to integrated behavioral health services.  PLAN: 1. Follow up with behavioral health clinician on : As needed 2. Behavioral recommendations:  Therapist, art-View online virtual hospital tour [facility] 3. Referral(s): [facility] 4. "From scale of 1-10, how likely are you to follow plan?": 10  Rae LipsJamie C Iesha Summerhill, LCSW  [facility] 08/10/2017 07/12/2014  Decreased Interest 0 0  Down, Depressed, Hopeless 0 0  PHQ - 2 Score 0 0  Altered sleeping 0 -  Tired, decreased energy 0 -  Change in appetite 0 -  Feeling bad or failure about yourself  0 -  Trouble concentrating 0 -  Moving  slowly or fidgety/restless 0 -  Suicidal thoughts 0 -  PHQ-9 Score 0 -   GAD 7 : Generalized Anxiety Score 08/10/2017  Nervous, Anxious, on Edge 0  Control/stop worrying 0  Worry too much - different things 0  Trouble relaxing 0  Restless 0  Easily annoyed or irritable 0  Afraid - awful might happen 0  Total GAD 7 Score 0

## 2017-08-10 NOTE — Patient Instructions (Signed)

## 2017-08-11 LAB — PRENATAL PROFILE I(LABCORP)
Antibody Screen: NEGATIVE
BASOS: 0 %
Basophils Absolute: 0 10*3/uL (ref 0.0–0.2)
EOS (ABSOLUTE): 0.1 10*3/uL (ref 0.0–0.4)
Eos: 1 %
HEMATOCRIT: 35.9 % (ref 34.0–46.6)
HEMOGLOBIN: 12 g/dL (ref 11.1–15.9)
HEP B S AG: NEGATIVE
IMMATURE GRANULOCYTES: 0 %
Immature Grans (Abs): 0 10*3/uL (ref 0.0–0.1)
Lymphocytes Absolute: 1.7 10*3/uL (ref 0.7–3.1)
Lymphs: 17 %
MCH: 30.2 pg (ref 26.6–33.0)
MCHC: 33.4 g/dL (ref 31.5–35.7)
MCV: 90 fL (ref 79–97)
Monocytes Absolute: 0.6 10*3/uL (ref 0.1–0.9)
Monocytes: 6 %
Neutrophils Absolute: 7.4 10*3/uL — ABNORMAL HIGH (ref 1.4–7.0)
Neutrophils: 76 %
Platelets: 287 10*3/uL (ref 150–450)
RBC: 3.97 x10E6/uL (ref 3.77–5.28)
RDW: 14.5 % (ref 12.3–15.4)
RH TYPE: POSITIVE
RPR: NONREACTIVE
Rubella Antibodies, IGG: 9.99 index (ref >0.99)
WBC: 9.9 10*3/uL (ref 3.4–10.8)

## 2017-08-11 LAB — CERVICOVAGINAL ANCILLARY ONLY
Chlamydia: NEGATIVE
Neisseria Gonorrhea: NEGATIVE

## 2017-08-12 LAB — CYTOLOGY - PAP
Chlamydia: NEGATIVE
Diagnosis: NEGATIVE
HPV (WINDOPATH): NOT DETECTED
NEISSERIA GONORRHEA: NEGATIVE

## 2017-08-14 LAB — CULTURE, OB URINE

## 2017-08-14 LAB — URINE CULTURE, OB REFLEX

## 2017-08-31 ENCOUNTER — Encounter (HOSPITAL_COMMUNITY): Payer: Self-pay

## 2017-09-08 ENCOUNTER — Other Ambulatory Visit (HOSPITAL_COMMUNITY): Payer: Self-pay | Admitting: *Deleted

## 2017-09-08 ENCOUNTER — Ambulatory Visit (HOSPITAL_COMMUNITY)
Admission: RE | Admit: 2017-09-08 | Discharge: 2017-09-08 | Disposition: A | Discharge status: Home / Self Care | Source: Ambulatory Visit | Attending: Advanced Practice Midwife | Admitting: Advanced Practice Midwife

## 2017-09-08 DIAGNOSIS — Z3482 Encounter for supervision of other normal pregnancy, second trimester: Secondary | ICD-10-CM | POA: Insufficient documentation

## 2017-09-08 DIAGNOSIS — Z363 Encounter for antenatal screening for malformations: Secondary | ICD-10-CM | POA: Diagnosis not present

## 2017-09-08 DIAGNOSIS — Z3A19 19 weeks gestation of pregnancy: Secondary | ICD-10-CM | POA: Diagnosis not present

## 2017-09-08 DIAGNOSIS — Z362 Encounter for other antenatal screening follow-up: Secondary | ICD-10-CM

## 2017-09-09 ENCOUNTER — Ambulatory Visit (INDEPENDENT_AMBULATORY_CARE_PROVIDER_SITE_OTHER): Admitting: Advanced Practice Midwife

## 2017-09-09 VITALS — BP 98/51 | HR 77 | Wt 150.4 lb

## 2017-09-09 DIAGNOSIS — A749 Chlamydial infection, unspecified: Secondary | ICD-10-CM

## 2017-09-09 DIAGNOSIS — Z3482 Encounter for supervision of other normal pregnancy, second trimester: Secondary | ICD-10-CM

## 2017-09-09 DIAGNOSIS — O98819 Other maternal infectious and parasitic diseases complicating pregnancy, unspecified trimester: Secondary | ICD-10-CM

## 2017-09-09 NOTE — Progress Notes (Signed)
   PRENATAL VISIT NOTE  Subjective:  Kimberly [name] is a 22 y.o. G2P1001 at [redacted]w[redacted]d being seen today for ongoing prenatal care.  She is currently monitored for the following issues for this low-risk pregnancy and has Periumbilical abdominal pain; Ovarian cyst; Chronic constipation; Bipolar 1 disorder (HCC); Abnormal quad screen; Chlamydia infection affecting pregnancy; and Supervision of other normal pregnancy, antepartum on their problem list.  Patient reports no complaints.  Contractions: Not present. Vag. Bleeding: None.  Movement: Present. Denies leaking of fluid.   The following portions of the patient's history were reviewed and updated as appropriate: allergies, current medications, past family history, past medical history, past social history, past surgical history and problem list. Problem list updated.  Objective:   Vitals:   09/09/17 0917  BP: (!) 98/51  Pulse: 77  Weight: 150 lb 6.4 oz (68.2 kg)    Fetal Status: Fetal Heart Rate (bpm): 152   Movement: Present     General:  Alert, oriented and cooperative. Patient is in no acute distress.  Skin: Skin is warm and dry. No rash noted.   Cardiovascular: Normal heart rate noted  Respiratory: Normal respiratory effort, no problems with respiration noted  Abdomen: Soft, gravid, appropriate for gestational age.  Pain/Pressure: Present     Pelvic: Cervical exam deferred        Extremities: Normal range of motion.  Edema: None  Mental Status: Normal mood and affect. Normal behavior. Normal judgment and thought content.   Assessment and Plan:  Pregnancy: G2P1001 at [redacted]w[redacted]d  1. Encounter for supervision of other normal pregnancy in second trimester --No complaints or concerns, continue routine care --Patient continues to decline genetic testing due to [employer] coverage --No concerning findings on [place] 09/08/17, f/u to complete anatomy as scheduled for 10/08/17  2. Chlamydia infection affecting pregnancy, antepartum --Negative  08/10/2017  Preterm labor symptoms and general obstetric precautions including but not limited to vaginal bleeding, contractions, leaking of fluid and fetal movement were reviewed in detail with the patient. Please refer to After Visit Summary for other counseling recommendations.  Return in about 4 weeks (around 10/07/2017).  Future Appointments  Date Time Provider Department Center  10/08/2017  8:30 AM [facility] [place] 1 [facility]    Calvert Cantor, CNM  09/09/17  9:29 AM

## 2017-09-09 NOTE — Patient Instructions (Signed)
Second Trimester of Pregnancy The second trimester is from week 13 through week 28, month 4 through 6. This is often the time in pregnancy that you feel your best. Often times, morning sickness has lessened or quit. You may have more energy, and you may get hungry more often. Your unborn baby (fetus) is growing rapidly. At the end of the sixth month, he or she is about 9 inches long and weighs about 1 pounds. You will likely feel the baby move (quickening) between 18 and 20 weeks of pregnancy. Follow these instructions at home:  Avoid all smoking, herbs, and alcohol. Avoid drugs not approved by your doctor.  Do not use any tobacco products, including cigarettes, chewing tobacco, and electronic cigarettes. If you need help quitting, ask your doctor. You may get counseling or other support to help you quit.  Only take medicine as told by your doctor. Some medicines are safe and some are not during pregnancy.  Exercise only as told by your doctor. Stop exercising if you start having cramps.  Eat regular, healthy meals.  Wear a good support bra if your breasts are tender.  Do not use hot tubs, steam rooms, or saunas.  Wear your seat belt when driving.  Avoid raw meat, uncooked cheese, and liter boxes and soil used by cats.  Take your prenatal vitamins.  Take 1500-2000 milligrams of calcium daily starting at the 20th week of pregnancy until you deliver your baby.  Try taking medicine that helps you poop (stool softener) as needed, and if your doctor approves. Eat more fiber by eating fresh fruit, vegetables, and whole grains. Drink enough fluids to keep your pee (urine) clear or pale yellow.  Take warm water baths (sitz baths) to soothe pain or discomfort caused by hemorrhoids. Use hemorrhoid cream if your doctor approves.  If you have puffy, bulging veins (varicose veins), wear support hose. Raise (elevate) your feet for 15 minutes, 3-4 times a day. Limit salt in your diet.  Avoid heavy  lifting, wear low heals, and sit up straight.  Rest with your legs raised if you have leg cramps or low back pain.  Visit your dentist if you have not gone during your pregnancy. Use a soft toothbrush to brush your teeth. Be gentle when you floss.  You can have sex (intercourse) unless your doctor tells you not to.  Go to your doctor visits. Get help if:  You feel dizzy.  You have mild cramps or pressure in your lower belly (abdomen).  You have a nagging pain in your belly area.  You continue to feel sick to your stomach (nauseous), throw up (vomit), or have watery poop (diarrhea).  You have bad smelling fluid coming from your vagina.  You have pain with peeing (urination). Get help right away if:  You have a fever.  You are leaking fluid from your vagina.  You have spotting or bleeding from your vagina.  You have severe belly cramping or pain.  You lose or gain weight rapidly.  You have trouble catching your breath and have chest pain.  You notice sudden or extreme puffiness (swelling) of your face, hands, ankles, feet, or legs.  You have not felt the baby move in over an hour.  You have severe headaches that do not go away with medicine.  You have vision changes. This information is not intended to replace advice given to you by your health care provider. Make sure you discuss any questions you have with your health care   provider. Document Released: 03/19/2009 Document Revised: 05/31/2015 Document Reviewed: 02/24/2012 Elsevier Interactive Patient Education  2017 Elsevier Inc.  

## 2017-10-08 ENCOUNTER — Ambulatory Visit (INDEPENDENT_AMBULATORY_CARE_PROVIDER_SITE_OTHER): Admitting: Obstetrics and Gynecology

## 2017-10-08 ENCOUNTER — Encounter (HOSPITAL_COMMUNITY): Payer: Self-pay

## 2017-10-08 ENCOUNTER — Ambulatory Visit (HOSPITAL_COMMUNITY)
Admission: RE | Admit: 2017-10-08 | Discharge: 2017-10-08 | Disposition: A | Discharge status: Home / Self Care | Source: Ambulatory Visit | Attending: Advanced Practice Midwife | Admitting: Advanced Practice Midwife

## 2017-10-08 DIAGNOSIS — O321XX0 Maternal care for breech presentation, not applicable or unspecified: Secondary | ICD-10-CM | POA: Diagnosis not present

## 2017-10-08 DIAGNOSIS — O98812 Other maternal infectious and parasitic diseases complicating pregnancy, second trimester: Secondary | ICD-10-CM

## 2017-10-08 DIAGNOSIS — Z23 Encounter for immunization: Secondary | ICD-10-CM

## 2017-10-08 DIAGNOSIS — Z362 Encounter for other antenatal screening follow-up: Secondary | ICD-10-CM

## 2017-10-08 DIAGNOSIS — A749 Chlamydial infection, unspecified: Secondary | ICD-10-CM

## 2017-10-08 DIAGNOSIS — Z3A23 23 weeks gestation of pregnancy: Secondary | ICD-10-CM | POA: Diagnosis not present

## 2017-10-08 DIAGNOSIS — Z3482 Encounter for supervision of other normal pregnancy, second trimester: Secondary | ICD-10-CM

## 2017-10-08 DIAGNOSIS — Z3480 Encounter for supervision of other normal pregnancy, unspecified trimester: Secondary | ICD-10-CM

## 2017-10-08 DIAGNOSIS — O98811 Other maternal infectious and parasitic diseases complicating pregnancy, first trimester: Secondary | ICD-10-CM

## 2017-10-08 NOTE — Progress Notes (Signed)
   PRENATAL VISIT NOTE  Subjective:  Kimberly [name] is a 22 y.o. G2P1001 at [redacted]w[redacted]d being seen today for ongoing prenatal care.  She is currently monitored for the following issues for this low-risk pregnancy and has Periumbilical abdominal pain; Ovarian cyst; Chronic constipation; Bipolar 1 disorder (HCC); Abnormal quad screen; Chlamydia infection affecting pregnancy; and Supervision of other normal pregnancy, antepartum on their problem list.  Patient reports no complaints.  Contractions: Irritability. Vag. Bleeding: None.  Movement: Present. Denies leaking of fluid.   The following portions of the patient's history were reviewed and updated as appropriate: allergies, current medications, past family history, past medical history, past social history, past surgical history and problem list. Problem list updated.  Objective:   Vitals:   10/08/17 1104  BP: (!) 94/54  Pulse: 78  Weight: 157 lb 1.6 oz (71.3 kg)    Fetal Status: Fetal Heart Rate (bpm): 160   Movement: Present     General:  Alert, oriented and cooperative. Patient is in no acute distress.  Skin: Skin is warm and dry. No rash noted.   Cardiovascular: Normal heart rate noted  Respiratory: Normal respiratory effort, no problems with respiration noted  Abdomen: Soft, gravid, appropriate for gestational age.  Pain/Pressure: Absent     Pelvic: Cervical exam deferred        Extremities: Normal range of motion.  Edema: None  Mental Status: Normal mood and affect. Normal behavior. Normal judgment and thought content.   Assessment and Plan:  Pregnancy: G2P1001 at [redacted]w[redacted]d  1. Supervision of other normal pregnancy, antepartum  - Flu Vaccine QUAD 36+ mos IM - Doing well. Would like to avoid being induced this pregnancy.   Preterm labor symptoms and general obstetric precautions including but not limited to vaginal bleeding, contractions, leaking of fluid and fetal movement were reviewed in detail with the patient. Please refer to  After Visit Summary for other counseling recommendations.  No follow-ups on file.  No future appointments.  Venia Carbon, NP

## 2017-11-05 ENCOUNTER — Other Ambulatory Visit

## 2017-11-05 ENCOUNTER — Ambulatory Visit (INDEPENDENT_AMBULATORY_CARE_PROVIDER_SITE_OTHER): Admitting: Obstetrics & Gynecology

## 2017-11-05 VITALS — BP 115/66 | HR 84 | Wt 164.4 lb

## 2017-11-05 DIAGNOSIS — Z23 Encounter for immunization: Secondary | ICD-10-CM

## 2017-11-05 DIAGNOSIS — Z3480 Encounter for supervision of other normal pregnancy, unspecified trimester: Secondary | ICD-10-CM

## 2017-11-05 DIAGNOSIS — Z3482 Encounter for supervision of other normal pregnancy, second trimester: Secondary | ICD-10-CM

## 2017-11-05 NOTE — Progress Notes (Signed)
   PRENATAL VISIT NOTE  Subjective:  Kimberly [name] is a 22 y.o. G2P1001 at [redacted]w[redacted]d being seen today for ongoing prenatal care.  She is currently monitored for the following issues for this low-risk pregnancy and has Ovarian cyst; Chronic constipation; Bipolar 1 disorder (HCC); Chlamydia infection affecting pregnancy; and Supervision of other normal pregnancy, antepartum on their problem list.  Patient reports no complaints.  Contractions: Irritability. Vag. Bleeding: None.  Movement: Present. Denies leaking of fluid.   The following portions of the patient's history were reviewed and updated as appropriate: allergies, current medications, past family history, past medical history, past social history, past surgical history and problem list. Problem list updated.  Objective:   Vitals:   11/05/17 0836  BP: 115/66  Pulse: 84  Weight: 164 lb 6.4 oz (74.6 kg)    Fetal Status: Fetal Heart Rate (bpm): 150   Movement: Present     General:  Alert, oriented and cooperative. Patient is in no acute distress.  Skin: Skin is warm and dry. No rash noted.   Cardiovascular: Normal heart rate noted  Respiratory: Normal respiratory effort, no problems with respiration noted  Abdomen: Soft, gravid, appropriate for gestational age.  Pain/Pressure: Present     Pelvic: Cervical exam deferred        Extremities: Normal range of motion.  Edema: None  Mental Status: Normal mood and affect. Normal behavior. Normal judgment and thought content.   Assessment and Plan:  Pregnancy: G2P1001 at 106w3d  1. Supervision of other normal pregnancy, antepartum - TDAP, glucola, labs today  Preterm labor symptoms and general obstetric precautions including but not limited to vaginal bleeding, contractions, leaking of fluid and fetal movement were reviewed in detail with the patient. Please refer to After Visit Summary for other counseling recommendations.  Return in about 3 weeks (around 11/26/2017).  No future  appointments.  Allie Bossier, MD

## 2017-11-06 LAB — GLUCOSE TOLERANCE, 2 HOURS W/ 1HR
GLUCOSE, 1 HOUR: 159 mg/dL (ref 65–179)
Glucose, 2 hour: 104 mg/dL (ref 65–152)
Glucose, Fasting: 80 mg/dL (ref 65–91)

## 2017-11-06 LAB — HIV ANTIBODY (ROUTINE TESTING W REFLEX): HIV Screen 4th Generation wRfx: NONREACTIVE

## 2017-11-06 LAB — CBC
HEMOGLOBIN: 12.3 g/dL (ref 11.1–15.9)
Hematocrit: 35.9 % (ref 34.0–46.6)
MCH: 30.9 pg (ref 26.6–33.0)
MCHC: 34.3 g/dL (ref 31.5–35.7)
MCV: 90 fL (ref 79–97)
Platelets: 217 10*3/uL (ref 150–450)
RBC: 3.98 x10E6/uL (ref 3.77–5.28)
RDW: 12.6 % (ref 12.3–15.4)
WBC: 11.4 10*3/uL — ABNORMAL HIGH (ref 3.4–10.8)

## 2017-11-06 LAB — RPR: RPR: NONREACTIVE

## 2017-11-26 ENCOUNTER — Ambulatory Visit (INDEPENDENT_AMBULATORY_CARE_PROVIDER_SITE_OTHER): Admitting: Obstetrics and Gynecology

## 2017-11-26 DIAGNOSIS — Z3480 Encounter for supervision of other normal pregnancy, unspecified trimester: Secondary | ICD-10-CM

## 2017-11-26 DIAGNOSIS — Z3A30 30 weeks gestation of pregnancy: Secondary | ICD-10-CM

## 2017-11-26 DIAGNOSIS — Z3483 Encounter for supervision of other normal pregnancy, third trimester: Secondary | ICD-10-CM

## 2017-11-26 MED ORDER — MEDELA PUMP IN STYLE MISC: 1.0000 | Freq: Once | 0 refills | Status: AC

## 2017-11-26 NOTE — Progress Notes (Signed)
   PRENATAL VISIT NOTE  Subjective:  Kimberly [name] is a 22 y.o. G2P1001 at 4768w3d being seen today for ongoing prenatal care.  She is currently monitored for the following issues for this low-risk pregnancy and has Ovarian cyst; Chronic constipation; Bipolar 1 disorder (HCC); Chlamydia infection affecting pregnancy; and Supervision of other normal pregnancy, antepartum on their problem list.  Patient reports no complaints.  Contractions: Irritability. Vag. Bleeding: None.  Movement: Present. Denies leaking of fluid.   The following portions of the patient's history were reviewed and updated as appropriate: allergies, current medications, past family history, past medical history, past social history, past surgical history and problem list. Problem list updated.  Objective:   Vitals:   11/26/17 0941  BP: 121/66  Pulse: 93  Weight: 167 lb (75.8 kg)    Fetal Status: Fetal Heart Rate (bpm): 151   Movement: Present     General:  Alert, oriented and cooperative. Patient is in no acute distress.  Skin: Skin is warm and dry. No rash noted.   Cardiovascular: Normal heart rate noted  Respiratory: Normal respiratory effort, no problems with respiration noted  Abdomen: Soft, gravid, appropriate for gestational age.  Pain/Pressure: Present     Pelvic: Cervical exam deferred        Extremities: Normal range of motion.  Edema: None  Mental Status: Normal mood and affect. Normal behavior. Normal judgment and thought content.   Assessment and Plan:  Pregnancy: G2P1001 at 3668w3d  1. Supervision of other normal pregnancy, antepartum  - Rx for breast pump for patient's insurance.  - Doing well, round ligament pain at times. Baby moving well. 2 hour GTT normal.   Preterm labor symptoms and general obstetric precautions including but not limited to vaginal bleeding, contractions, leaking of fluid and fetal movement were reviewed in detail with the patient. Please refer to After Visit Summary for  other counseling recommendations.  No follow-ups on file.  No future appointments.  Venia CarbonJennifer Shykeria Sakamoto, NP

## 2017-12-10 ENCOUNTER — Ambulatory Visit (INDEPENDENT_AMBULATORY_CARE_PROVIDER_SITE_OTHER): Payer: Medicaid Other | Admitting: Obstetrics and Gynecology

## 2017-12-10 VITALS — BP 115/78 | HR 76 | Wt 169.6 lb

## 2017-12-10 DIAGNOSIS — Z3483 Encounter for supervision of other normal pregnancy, third trimester: Secondary | ICD-10-CM

## 2017-12-10 DIAGNOSIS — Z3480 Encounter for supervision of other normal pregnancy, unspecified trimester: Secondary | ICD-10-CM

## 2017-12-10 DIAGNOSIS — Z3A32 32 weeks gestation of pregnancy: Secondary | ICD-10-CM

## 2017-12-10 NOTE — Patient Instructions (Signed)

## 2017-12-10 NOTE — Progress Notes (Signed)
   PRENATAL VISIT NOTE  Subjective:  Kimberly [name] is a 22 y.o. G2P1001 at 4825w3d being seen today for ongoiMalachy Chamberng prenatal care.  She is currently monitored for the following issues for this low-risk pregnancy and has Ovarian cyst; Chronic constipation; Bipolar 1 disorder (HCC); Chlamydia infection affecting pregnancy; and Supervision of other normal pregnancy, antepartum on their problem list.  Patient reports Sharp pains in vagina with pressure.   Contractions: Irritability. Vag. Bleeding: None.  Movement: Present. Denies leaking of fluid.   The following portions of the patient's history were reviewed and updated as appropriate: allergies, current medications, past family history, past medical history, past social history, past surgical history and problem list. Problem list updated.  Objective:   Vitals:   12/10/17 1635  BP: 115/78  Pulse: 76  Weight: 169 lb 9.6 oz (76.9 kg)    Fetal Status: Fetal Heart Rate (bpm): 156 Fundal Height: 31 cm Movement: Present  Presentation: Vertex  General:  Alert, oriented and cooperative. Patient is in no acute distress.  Skin: Skin is warm and dry. No rash noted.   Cardiovascular: Normal heart rate noted  Respiratory: Normal respiratory effort, no problems with respiration noted  Abdomen: Soft, gravid, appropriate for gestational age.  Pain/Pressure: Present     Pelvic: Cervical exam performed Dilation: Closed      Extremities: Normal range of motion.  Edema: None  Mental Status: Normal mood and affect. Normal behavior. Normal judgment and thought content.   Assessment and Plan:  Pregnancy: G2P1001 at 4625w3d  1. Supervision of other normal pregnancy, antepartum  - doing well - pelvic pressure, continue pregnancy support belt. Cervix closed today.   There are no diagnoses linked to this encounter. Preterm labor symptoms and general obstetric precautions including but not limited to vaginal bleeding, contractions, leaking of fluid and fetal  movement were reviewed in detail with the patient. Please refer to After Visit Summary for other counseling recommendations.  Return in about 2 weeks (around 12/24/2017).  Future Appointments  Date Time Provider Department Center  12/24/2017  9:55 AM Rasch, Harolyn RutherfordJennifer I, NP [facility]    Venia CarbonJennifer Rasch, NP

## 2017-12-24 ENCOUNTER — Ambulatory Visit (INDEPENDENT_AMBULATORY_CARE_PROVIDER_SITE_OTHER): Payer: Medicaid Other | Admitting: Obstetrics and Gynecology

## 2017-12-24 MED ORDER — CYCLOBENZAPRINE HCL 10 MG PO TABS: 10.0000 mg | ORAL_TABLET | Freq: Three times a day (TID) | ORAL | 1 refills | Status: DC | PRN

## 2017-12-24 NOTE — Addendum Note (Signed)
Addended by: Ernestina PatchesAPEL, Tarica Harl S on: 12/24/2017 11:03 AM   Modules accepted: Orders

## 2017-12-24 NOTE — Progress Notes (Signed)
   PRENATAL VISIT NOTE  Subjective:  Kimberly [name] is a 22 y.o. G2P1001 at 7020w3d being seen today for ongoing prenatal care.  She is currently monitored for the following issues for this low-risk pregnancy and has Ovarian cyst; Chronic constipation; Bipolar 1 disorder (HCC); Chlamydia infection affecting pregnancy; Supervision of other normal pregnancy, antepartum; and Separation of symphysis pubis during delivery on their problem list.  Patient reports pelvic pain, symphysis pubic pain , wearing a support belt. Pain is worse at night.  Contractions: Irritability. Vag. Bleeding: None.  Movement: Present. Denies leaking of fluid.   The following portions of the patient's history were reviewed and updated as appropriate: allergies, current medications, past family history, past medical history, past social history, past surgical history and problem list. Problem list updated.  Objective:   Vitals:   12/24/17 1017  BP: 117/62  Weight: 173 lb 11.2 oz (78.8 kg)    Fetal Status: Fetal Heart Rate (bpm): 158 Fundal Height: 33 cm Movement: Present     General:  Alert, oriented and cooperative. Patient is in no acute distress.  Skin: Skin is warm and dry. No rash noted.   Cardiovascular: Normal heart rate noted  Respiratory: Normal respiratory effort, no problems with respiration noted  Abdomen: Soft, gravid, appropriate for gestational age.  Pain/Pressure: Present     Pelvic: Cervical exam deferred        Extremities: Normal range of motion.  Edema: None  Mental Status: Normal mood and affect. Normal behavior. Normal judgment and thought content.   Assessment and Plan:  Pregnancy: G2P1001 at 4320w3d  1. Separation of symphysis pubis during delivery  - Flexeril for night time.  - Ambulatory referral to Physical Therapy  There are no diagnoses linked to this encounter. Preterm labor symptoms and general obstetric precautions including but not limited to vaginal bleeding, contractions,  leaking of fluid and fetal movement were reviewed in detail with the patient. Please refer to After Visit Summary for other counseling recommendations.  Return in about 2 weeks (around 01/07/2018).  No future appointments.  Venia CarbonJennifer Jazmina Muhlenkamp, NP

## 2017-12-24 NOTE — Addendum Note (Signed)
Addended by: Venia CarbonASCH, Kwame Ryland I on: 12/24/2017 11:09 AM   Modules accepted: Orders

## 2017-12-28 ENCOUNTER — Encounter (HOSPITAL_COMMUNITY): Payer: Self-pay | Admitting: *Deleted

## 2017-12-28 ENCOUNTER — Inpatient Hospital Stay (HOSPITAL_COMMUNITY)
Admission: AD | Admit: 2017-12-28 | Discharge: 2017-12-28 | Disposition: A | Discharge status: Home / Self Care | Payer: Medicaid Other | Attending: Obstetrics & Gynecology | Admitting: Obstetrics & Gynecology

## 2017-12-28 DIAGNOSIS — O3483 Maternal care for other abnormalities of pelvic organs, third trimester: Secondary | ICD-10-CM | POA: Diagnosis not present

## 2017-12-28 DIAGNOSIS — R102 Pelvic and perineal pain: Secondary | ICD-10-CM | POA: Diagnosis present

## 2017-12-28 DIAGNOSIS — Z79899 Other long term (current) drug therapy: Secondary | ICD-10-CM | POA: Insufficient documentation

## 2017-12-28 DIAGNOSIS — N83209 Unspecified ovarian cyst, unspecified side: Secondary | ICD-10-CM | POA: Diagnosis not present

## 2017-12-28 DIAGNOSIS — Z3A35 35 weeks gestation of pregnancy: Secondary | ICD-10-CM

## 2017-12-28 DIAGNOSIS — F319 Bipolar disorder, unspecified: Secondary | ICD-10-CM | POA: Insufficient documentation

## 2017-12-28 DIAGNOSIS — O99343 Other mental disorders complicating pregnancy, third trimester: Secondary | ICD-10-CM | POA: Insufficient documentation

## 2017-12-28 DIAGNOSIS — O26893 Other specified pregnancy related conditions, third trimester: Secondary | ICD-10-CM | POA: Diagnosis present

## 2017-12-28 LAB — URINALYSIS, ROUTINE W REFLEX MICROSCOPIC
Bilirubin Urine: NEGATIVE
GLUCOSE, UA: NEGATIVE mg/dL
Hgb urine dipstick: NEGATIVE
Ketones, ur: NEGATIVE mg/dL
Nitrite: NEGATIVE
PH: 7 (ref 5.0–8.0)
Protein, ur: NEGATIVE mg/dL
SPECIFIC GRAVITY, URINE: 1.004 — AB (ref 1.005–1.030)

## 2017-12-28 LAB — WET PREP, GENITAL
Clue Cells Wet Prep HPF POC: NONE SEEN
SPERM: NONE SEEN
TRICH WET PREP: NONE SEEN
Yeast Wet Prep HPF POC: NONE SEEN

## 2017-12-28 NOTE — MAU Note (Signed)
Pt presents to MAU c/o pelvic pain and pressure. Pt states on Friday night she started experiencing these symptoms and they have since then worsened. Pt states when she feels down close to her vagina she feels "her insides." no bleeding or LOF. +FM. Occasional ctxs.

## 2017-12-28 NOTE — Discharge Instructions (Signed)
Your cervix is not dilated and there are no contractions on the monitor tonight. Go home and get rest. You can soak in a warm tub of water x 15 mins, take Tylenol 1000 mg every 6 hrs as needed for pain. Make sure you are wearing the maternity support belt all day everyday (except for showering).

## 2017-12-28 NOTE — MAU Provider Note (Signed)
Chief Complaint:  Pelvic Pain   First Provider Initiated Contact with Patient 12/28/17 0109     HPI: Kimberly [name] is a 22 y.o. G2P1001 at 8113w0d who presents to maternity admissions reporting pelvic pain and pressure started Friday night (12/25/17). She reports occ. UC's. She has been dx'd with symphysis pubis pain, she take Flexeril and wears a maternity support belt.  Denies leakage of fluid or vaginal bleeding. Good fetal movement.  Location: pelvis Quality: pressure Severity: 5/10 in pain scale Duration: since 3 days Timing: constant Modifying factors: nothing Associated signs and symptoms: occ UC's  Pregnancy Course:   Past Medical History:  Diagnosis Date  . Bipolar 1 disorder (HCC)   . Constipation   . Enteritis   . No pertinent past medical history   . Ovarian cyst    OB History  Gravida Para Term Preterm AB Living  2 1 1  0 0 1  SAB TAB Ectopic Multiple Live Births  0 0 0 0 1    # Outcome Date GA Lbr Len/2nd Weight Sex Delivery Anes PTL Lv  2 Current           1 Term 2016 5878w0d  3005 g F Vag-Spont EPI  LIV   Past Surgical History:  Procedure Laterality Date  . TONSILLECTOMY     Family History  Problem Relation Age of Onset  . Asthma Brother   . Hypertension Mother   . Diabetes Paternal Uncle   . Diabetes Maternal Grandmother   . Cancer Maternal Grandmother   . Diabetes Maternal Grandfather   . Cancer Maternal Grandfather   . Stroke Maternal Grandfather   . Hirschsprung's disease Neg Hx    Social History   Tobacco Use  . Smoking status: Never Smoker  . Smokeless tobacco: Never Used  Substance Use Topics  . Alcohol use: No  . Drug use: No   No Known Allergies Medications Prior to Admission  Medication Sig Dispense Refill Last Dose  . cyclobenzaprine (FLEXERIL) 10 MG tablet Take 1 tablet (10 mg total) by mouth every 8 (eight) hours as needed for muscle spasms. 30 tablet 1 Past Week at Unknown time  . Ferrous Fumarate (HEMOCYTE - 106 MG FE) 324  (106 Fe) MG TABS tablet Take 1 tablet by mouth.   12/27/2017 at Unknown time  . Prenatal Vit-Fe Fumarate-FA (PRENATAL MULTIVITAMIN) TABS tablet Take 1 tablet by mouth daily at 12 noon.   12/27/2017 at Unknown time    I have reviewed patient's Past Medical Hx, Surgical Hx, Family Hx, Social Hx, medications and allergies.   ROS:  Review of Systems  Constitutional: Negative.   HENT: Negative.   Eyes: Negative.   Respiratory: Negative.   Cardiovascular: Negative.   Gastrointestinal: Negative.   Endocrine: Negative.   Genitourinary: Positive for pelvic pain.       Can see some of insides of vagina (shows picture on phone)  Musculoskeletal: Negative.   Skin: Negative.   Allergic/Immunologic: Negative.   Neurological: Negative.   Hematological: Negative.   Psychiatric/Behavioral: Negative.     Physical Exam   Patient Vitals for the past 24 hrs:  BP Temp Temp src Pulse Resp Height Weight  12/28/17 0044 123/66 (!) 97.3 F (36.3 C) Oral 80 18 5\' 5"  (1.651 m) 79 kg    Constitutional: Well-developed, well-nourished female in no acute distress.  Cardiovascular: normal rate & rhythm, no murmur Respiratory: normal effort, lung sounds clear throughout GI: Abd soft, non-tender, gravid appropriate for gestational age. Pos BS x  4 MS: Extremities nontender, no edema, normal ROM Neurologic: Alert and oriented x 4.  GU:      Pelvic: NEFG, physiologic discharge, no blood, cervix clean.    Wet prep done. Anterior wall of vagina protrudes down to introitus (but not out) with Valsalva maneuver  NST - FHR: 145 bpm / moderate variability / accels present / decels absent / TOCO: Occ UC with UI noted   Labs: Results for orders placed or performed during the hospital encounter of 12/28/17 (from the past 24 hour(s))  Urinalysis, Routine w reflex microscopic     Status: Abnormal   Collection Time: 12/28/17 12:53 AM  Result Value Ref Range   Color, Urine STRAW (A) YELLOW   APPearance CLEAR CLEAR    Specific Gravity, Urine 1.004 (L) 1.005 - 1.030   pH 7.0 5.0 - 8.0   Glucose, UA NEGATIVE NEGATIVE mg/dL   Hgb urine dipstick NEGATIVE NEGATIVE   Bilirubin Urine NEGATIVE NEGATIVE   Ketones, ur NEGATIVE NEGATIVE mg/dL   Protein, ur NEGATIVE NEGATIVE mg/dL   Nitrite NEGATIVE NEGATIVE   Leukocytes, UA SMALL (A) NEGATIVE   RBC / HPF 0-5 0 - 5 RBC/hpf   WBC, UA 0-5 0 - 5 WBC/hpf   Bacteria, UA MANY (A) NONE SEEN   Squamous Epithelial / LPF 0-5 0 - 5   Mucus PRESENT   Wet prep, genital     Status: Abnormal   Collection Time: 12/28/17  1:21 AM  Result Value Ref Range   Yeast Wet Prep HPF POC NONE SEEN NONE SEEN   Trich, Wet Prep NONE SEEN NONE SEEN   Clue Cells Wet Prep HPF POC NONE SEEN NONE SEEN   WBC, Wet Prep HPF POC MODERATE (A) NONE SEEN   Sperm NONE SEEN     Imaging:  No results found.  MAU Course: Orders Placed This Encounter  Procedures  . Wet prep, genital  . Urinalysis, Routine w reflex microscopic  . Discharge patient   No orders of the defined types were placed in this encounter.   MDM:  Assessment: 1. Pelvic pain affecting pregnancy in third trimester, antepartum     Plan: Discharge home in stable condition.  Labor precautions and fetal kick counts Discuss possible fitting for pessary, if needed for protruding vaginal wall  Follow-up Information    [facility]   Specialty:  Obstetrics and Gynecology Why:  As scheduled Contact information: [address] [place] [id number] [phone]          Allergies as of 12/28/2017   No Known Allergies     Medication List    TAKE these medications   cyclobenzaprine 10 MG tablet Commonly known as:  FLEXERIL Take 1 tablet (10 mg total) by mouth every 8 (eight) hours as needed for muscle spasms.   Ferrous Fumarate 324 (106 Fe) MG Tabs tablet Commonly known as:  HEMOCYTE - 106 mg FE Take 1 tablet by mouth.   prenatal multivitamin Tabs  tablet Take 1 tablet by mouth daily at 12 noon.       Raelyn MoraDawson, Brizza Nathanson, CNM 12/28/2017  1:09 AM

## 2018-01-06 NOTE — L&D Delivery Note (Signed)
Patient: Kimberly [name] MRN: [id number]  GBS status: Negative, IAP given: None   Patient is a 23 y.o. now G2P2 s/p NSVD at [redacted]w[redacted]d, who was admitted for elective IOL. SROM 4h 13m prior to delivery with clear fluid.    Delivery Note At 8:14 AM a viable female was delivered via Vaginal, Spontaneous (Presentation: OA ).  APGAR: 8, 9; weight pending .   Placenta status:spontaneous, intact.  Cord: 3 vessel with the following complications: none.   Anesthesia:  Epidural  Episiotomy: None Lacerations: None Suture Repair: None Est. Blood Loss (mL): 19  Head delivered OA. No nuchal cord present. Shoulder and body delivered in usual fashion. Infant with spontaneous cry, placed on mother's abdomen, dried and bulb suctioned. Cord clamped x 2 after 1-minute delay, and cut by family member. Cord blood drawn. Placenta delivered spontaneously with gentle cord traction. Fundus firm with massage and Pitocin. Perineum inspected and found to have no lacerations.  Mom to postpartum.  Baby to Couplet care / Skin to Skin.  De Hollingshead 01/26/2018, 8:39 AM

## 2018-01-07 ENCOUNTER — Ambulatory Visit (INDEPENDENT_AMBULATORY_CARE_PROVIDER_SITE_OTHER): Payer: Medicaid Other | Admitting: Obstetrics and Gynecology

## 2018-01-07 ENCOUNTER — Encounter: Payer: Self-pay | Admitting: Obstetrics and Gynecology

## 2018-01-07 VITALS — BP 110/75 | HR 97 | Wt 176.7 lb

## 2018-01-07 DIAGNOSIS — A749 Chlamydial infection, unspecified: Secondary | ICD-10-CM

## 2018-01-07 DIAGNOSIS — Z3480 Encounter for supervision of other normal pregnancy, unspecified trimester: Secondary | ICD-10-CM

## 2018-01-07 DIAGNOSIS — O98811 Other maternal infectious and parasitic diseases complicating pregnancy, first trimester: Secondary | ICD-10-CM

## 2018-01-07 DIAGNOSIS — R102 Pelvic and perineal pain: Secondary | ICD-10-CM

## 2018-01-07 DIAGNOSIS — O26893 Other specified pregnancy related conditions, third trimester: Secondary | ICD-10-CM

## 2018-01-07 DIAGNOSIS — O3483 Maternal care for other abnormalities of pelvic organs, third trimester: Secondary | ICD-10-CM

## 2018-01-07 DIAGNOSIS — N8110 Cystocele, unspecified: Secondary | ICD-10-CM

## 2018-01-07 DIAGNOSIS — O26899 Other specified pregnancy related conditions, unspecified trimester: Secondary | ICD-10-CM

## 2018-01-07 LAB — OB RESULTS CONSOLE GBS: GBS: NEGATIVE

## 2018-01-07 MED ORDER — OXYCODONE-ACETAMINOPHEN 5-325 MG PO TABS: 1.0000 | ORAL_TABLET | ORAL | 0 refills | Status: DC | PRN

## 2018-01-07 NOTE — Progress Notes (Signed)
   PRENATAL VISIT NOTE  Subjective:  Kimberly [name] is a 23 y.o. G2P1001 at [redacted]w[redacted]d being seen today for ongoing prenatal care.  She is currently monitored for the following issues for this low-risk pregnancy and has Ovarian cyst; Chronic constipation; Bipolar 1 disorder (HCC); Chlamydia infection affecting pregnancy; Supervision of other normal pregnancy, antepartum; Separation of symphysis pubis during delivery; Pelvic pain affecting pregnancy in third trimester, antepartum; and Cystocele affecting pregnancy in third trimester on their problem list.  Patient reports Pelvic pain. Was seen in MAU and was told she may be a candate for a pessary due to cystocele. The pelvic pain became worse while she was walking around [employer] and that it was brought her to a visit in MAU.  Contractions: Irritability. Vag. Bleeding: None.  Movement: Present. Denies leaking of fluid.   The following portions of the patient's history were reviewed and updated as appropriate: allergies, current medications, past family history, past medical history, past social history, past surgical history and problem list. Problem list updated.  Objective:   Vitals:   01/07/18 0904  BP: 110/75  Pulse: 97  Weight: 176 lb 11.2 oz (80.2 kg)    Fetal Status:   Fundal Height: 35 cm Movement: Present     General:  Alert, oriented and cooperative. Patient is in no acute distress.  Skin: Skin is warm and dry. No rash noted.   Cardiovascular: Normal heart rate noted  Respiratory: Normal respiratory effort, no problems with respiration noted  Abdomen: Soft, gravid, appropriate for gestational age.  Pain/Pressure: Present     Pelvic: Cervical exam performed Dilation: Closed. With valsalva there is a cystocele noted and protrudes to introitus; does not fully protrude out of the vagina.        Extremities: Normal range of motion.  Edema: None  Mental Status: Normal mood and affect. Normal behavior. Normal judgment and thought content.    Assessment and Plan:  Pregnancy: G2P1001 at [redacted]w[redacted]d  1. Pelvic pressure in pregnancy  - Likely 2/2 to cystocele. Discussed pessary with Dr. Alysia Penna, patient is almost 37 weeks. Unlikely to be helpful.  - Urinalysis, Routine w reflex microscopic - Urine Culture  2. Supervision of other normal pregnancy, antepartum  - Culture, beta strep (group b only)  3. Separation of symphysis pubis during delivery  - Scheduled with PT this month - Continue pregnancy support belt use - Rx Percocet for bedtime for severe pain.    4. Chlamydia infection affecting pregnancy in first trimester  - Negative 8/5; Will collect at next visit.  5. Pelvic pain affecting pregnancy in third trimester, antepartum   There are no diagnoses linked to this encounter. Preterm labor symptoms and general obstetric precautions including but not limited to vaginal bleeding, contractions, leaking of fluid and fetal movement were reviewed in detail with the patient. Please refer to After Visit Summary for other counseling recommendations.  Return in about 1 week (around 01/14/2018).  Future Appointments  Date Time Provider Department Center  01/14/2018  2:15 PM Rasch, Harolyn Rutherford, NP [facility]  01/21/2018  2:35 PM Rasch, Harolyn Rutherford, NP [facility]  01/21/2018  4:15 PM Crosser, Edward Jolly, PT [facility]  01/28/2018  2:35 PM Rasch, Harolyn Rutherford, NP [facility]    Venia Carbon, NP

## 2018-01-07 NOTE — Patient Instructions (Signed)
AREA PEDIATRIC/FAMILY PRACTICE PHYSICIANS  [facility] [address], [address] [place], [place]  [address] Phone - [phone]   Fax - [phone]  [facility] [address] [place], [place] [address] Phone - [phone]   Fax - [phone]  [facility] [address] [place], [place]  [address] Phone - [phone]   Fax - [phone]  [facility] [address] [place], [place]  [address] Phone - [phone]   Fax - [phone]  [facility] [address] [place], [place]  [address] Phone - [phone]   Fax - [phone]  [facility] [address] [place], [place]  [address] Phone - [phone]   Fax - [phone]  [facility] [address] [place], [place]  [address] Phone - [phone]   Fax - [phone]  [facility] [address], [address] [place], [place]  [address] Phone - [phone]   Fax - [phone]  [facility] [address] [place], [place]  [address] Phone - [phone]   Fax - [phone] [facility] [address] [place], [place]  [address] Phone - [phone]   Fax - [phone]  [facility] [address] [place], [place]  [address] Phone - [phone]   Fax - [phone]  [facility] [address] [place], [place]  [address] Phone - [phone]   Fax - [phone]  [facility] [address] [place], [place]  [address] Phone - [phone]   Fax - [phone]  [facility] GOSR[facility] [address] [place], [place]  [address] Phone - [phone]  [facility] [address] [place], [place]  [address] Phone - [phone]   Fax - [phone]  [facility] [address] [place], [place]  [address] Phone - [phone]   Fax - [phone]  [facility] [address] [place], [place]  [address] Phone - [phone]   Fax - [phone]  [facility] [address] [place], [place]  [address] Phone - [phone]   Fax - [phone]   [facility] [address] [place], [place]  [address] Phone - [phone]   Fax - [phone]  [facility] [address] [place], [place]  [address] Phone - [phone]   Fax - [phone]  DA[facility] RUB[facility] [address] [place], [place]  [address] Phone - [phone]   Fax - [phone]  [facility] [address] [place], [place]  [address] Phone - [phone]   Fax - [phone]  [facility] [address] [place], [place]  [address] Phone - [phone]   Fax - [phone] [facility] [address] [place], [place]  [address] Phone - [phone]   Fax - [phone]  [facility] [address] [place], [place]  [address] Phone - [phone]   Fax - [phone]  [facility] [address] [place], [place]  [address] Phone - [phone]   Fax - [phone]  [facility] Charlene Flemming MD [address]   [address] Phone [phone]  Fax [phone]   

## 2018-01-08 DIAGNOSIS — O3483 Maternal care for other abnormalities of pelvic organs, third trimester: Secondary | ICD-10-CM

## 2018-01-08 DIAGNOSIS — N8110 Cystocele, unspecified: Secondary | ICD-10-CM | POA: Insufficient documentation

## 2018-01-08 LAB — URINALYSIS, ROUTINE W REFLEX MICROSCOPIC
Bilirubin, UA: NEGATIVE
Glucose, UA: NEGATIVE
Ketones, UA: NEGATIVE
Nitrite, UA: NEGATIVE
PH UA: 5 (ref 5.0–7.5)
Protein, UA: NEGATIVE
RBC, UA: NEGATIVE
Specific Gravity, UA: 1.025 (ref 1.005–1.030)
Urobilinogen, Ur: 1 mg/dL (ref 0.2–1.0)

## 2018-01-08 LAB — MICROSCOPIC EXAMINATION: CASTS: NONE SEEN /LPF

## 2018-01-08 LAB — URINE CULTURE

## 2018-01-10 LAB — CULTURE, BETA STREP (GROUP B ONLY): STREP GP B CULTURE: NEGATIVE

## 2018-01-14 ENCOUNTER — Encounter: Payer: Self-pay | Admitting: Obstetrics and Gynecology

## 2018-01-14 ENCOUNTER — Other Ambulatory Visit (HOSPITAL_COMMUNITY)
Admission: RE | Admit: 2018-01-14 | Discharge: 2018-01-14 | Disposition: A | Discharge status: Home / Self Care | Payer: Medicaid Other | Source: Ambulatory Visit | Attending: Obstetrics and Gynecology | Admitting: Obstetrics and Gynecology

## 2018-01-14 ENCOUNTER — Ambulatory Visit (INDEPENDENT_AMBULATORY_CARE_PROVIDER_SITE_OTHER): Payer: Medicaid Other | Admitting: Obstetrics and Gynecology

## 2018-01-14 DIAGNOSIS — Z3A37 37 weeks gestation of pregnancy: Secondary | ICD-10-CM

## 2018-01-14 DIAGNOSIS — Z3480 Encounter for supervision of other normal pregnancy, unspecified trimester: Secondary | ICD-10-CM | POA: Insufficient documentation

## 2018-01-14 DIAGNOSIS — Z3483 Encounter for supervision of other normal pregnancy, third trimester: Secondary | ICD-10-CM

## 2018-01-14 LAB — OB RESULTS CONSOLE GC/CHLAMYDIA: Gonorrhea: NEGATIVE

## 2018-01-14 NOTE — Patient Instructions (Signed)

## 2018-01-14 NOTE — Progress Notes (Signed)
   PRENATAL VISIT NOTE  Subjective:  Kimberly [name] is a 23 y.o. G2P1001 at [redacted]w[redacted]d being seen today for ongoing prenatal care.  She is currently monitored for the following issues for this low-risk pregnancy and has Ovarian cyst; Chronic constipation; Bipolar 1 disorder (HCC); Chlamydia infection affecting pregnancy; Supervision of other normal pregnancy, antepartum; Separation of symphysis pubis during delivery; Pelvic pain affecting pregnancy in third trimester, antepartum; and Cystocele affecting pregnancy in third trimester on their problem list.  Patient reports contractions since Irregular  and no contractions.  Contractions: Irritability. Vag. Bleeding: None.  Movement: Present. Denies leaking of fluid.   The following portions of the patient's history were reviewed and updated as appropriate: allergies, current medications, past family history, past medical history, past social history, past surgical history and problem list. Problem list updated.  Objective:   Vitals:   01/14/18 1410  BP: 118/64  Pulse: 93  Weight: 177 lb (80.3 kg)    Fetal Status: Fetal Heart Rate (bpm): 155 Fundal Height: 37 cm Movement: Present  Presentation: Vertex  General:  Alert, oriented and cooperative. Patient is in no acute distress.  Skin: Skin is warm and dry. No rash noted.   Cardiovascular: Normal heart rate noted  Respiratory: Normal respiratory effort, no problems with respiration noted  Abdomen: Soft, gravid, appropriate for gestational age.  Pain/Pressure: Present     Pelvic: Cervical exam performed Dilation: Fingertip Effacement (%): Thick Station: Ballotable  Extremities: Normal range of motion.  Edema: None  Mental Status: Normal mood and affect. Normal behavior. Normal judgment and thought content.   Assessment and Plan:  Pregnancy: G2P1001 at [redacted]w[redacted]d  1. Supervision of other normal pregnancy, antepartum  - Still having symphysis pubic pain; she is miserable  - Pt scheduled for 1/16 -  Planning to pick up pain medications today - Discussed reasons to go to MAU  Term labor symptoms and general obstetric precautions including but not limited to vaginal bleeding, contractions, leaking of fluid and fetal movement were reviewed in detail with the patient. Please refer to After Visit Summary for other counseling recommendations.  Return in about 1 week (around 01/21/2018).  Future Appointments  Date Time Provider Department Center  01/21/2018  2:35 PM Chesnee Floren, Harolyn Rutherford, NP [facility]  01/21/2018  4:15 PM Crosser, Edward Jolly, PT [facility]  01/28/2018  2:35 PM Narissa Beaufort, Harolyn Rutherford, NP [facility]    Venia Carbon, NP

## 2018-01-15 LAB — GC/CHLAMYDIA PROBE AMP (~~LOC~~) NOT AT ARMC
Chlamydia: NEGATIVE
NEISSERIA GONORRHEA: NEGATIVE

## 2018-01-21 ENCOUNTER — Ambulatory Visit: Payer: Medicaid Other | Attending: Obstetrics and Gynecology | Admitting: Physical Therapy

## 2018-01-21 ENCOUNTER — Ambulatory Visit (INDEPENDENT_AMBULATORY_CARE_PROVIDER_SITE_OTHER): Payer: Medicaid Other | Admitting: Obstetrics and Gynecology

## 2018-01-21 DIAGNOSIS — Z3A38 38 weeks gestation of pregnancy: Secondary | ICD-10-CM

## 2018-01-21 DIAGNOSIS — Z3483 Encounter for supervision of other normal pregnancy, third trimester: Secondary | ICD-10-CM

## 2018-01-21 DIAGNOSIS — Z3480 Encounter for supervision of other normal pregnancy, unspecified trimester: Secondary | ICD-10-CM

## 2018-01-21 NOTE — Progress Notes (Signed)
   PRENATAL VISIT NOTE  Subjective:  Kimberly [name] is a 23 y.o. G2P1001 at [redacted]w[redacted]d being seen today for ongoing prenatal care.  She is currently monitored for the following issues for this low-risk pregnancy and has Ovarian cyst; Chronic constipation; Bipolar 1 disorder (HCC); Chlamydia infection affecting pregnancy; Supervision of other normal pregnancy, antepartum; Separation of symphysis pubis during delivery; Pelvic pain affecting pregnancy in third trimester, antepartum; and Cystocele affecting pregnancy in third trimester on their problem list.  Patient reports continued pelvic pain that is worse with walking and position change. She is taking percocet and has a PT visit today.  Contractions: Irritability. Vag. Bleeding: None.  Movement: Present. Denies leaking of fluid.   The following portions of the patient's history were reviewed and updated as appropriate: allergies, current medications, past family history, past medical history, past social history, past surgical history and problem list. Problem list updated.  Objective:   Vitals:   01/21/18 1447  BP: 126/84  Pulse: 88  Weight: 177 lb 14.4 oz (80.7 kg)    Fetal Status: Fetal Heart Rate (bpm): 135 Fundal Height: 38 cm Movement: Present  Presentation: Vertex  General:  Alert, oriented and cooperative. Patient is in no acute distress.  Skin: Skin is warm and dry. No rash noted.   Cardiovascular: Normal heart rate noted  Respiratory: Normal respiratory effort, no problems with respiration noted  Abdomen: Soft, gravid, appropriate for gestational age.  Pain/Pressure: Present     Pelvic: Cervical exam performed Dilation: 1.5 Effacement (%): Thick Station: Ballotable  Extremities: Normal range of motion.  Edema: None  Mental Status: Normal mood and affect. Normal behavior. Normal judgment and thought content.   Assessment and Plan:  Pregnancy: G2P1001 at [redacted]w[redacted]d  1. Separation of symphysis pubis during delivery  mul tip with  favorable cervix.  Very difficult few weeks with pubic pain. Patient currently in PT for separation. Induction scheduled for 39 weeks. Patient to come Monday morning for Foley bulb insertion. Induction scheduled for 1/21 at [place].  Continue percocet as needed for severe pain.   2. Supervision of other normal pregnancy, antepartum  Doing well, GBS negative    There are no diagnoses linked to this encounter. Term labor symptoms and general obstetric precautions including but not limited to vaginal bleeding, contractions, leaking of fluid and fetal movement were reviewed in detail with the patient. Please refer to After Visit Summary for other counseling recommendations.  Return in about 4 days (around 01/25/2018) for for NST and foley placement. AM appointment please , Diabetes Educator.  Future Appointments  Date Time Provider Department Center  01/21/2018  4:15 PM Joellyn Quails, PT [facility]  01/26/2018 12:00 AM [facility] ROOM [facility] None    Venia Carbon, NP

## 2018-01-21 NOTE — Patient Instructions (Signed)

## 2018-01-22 ENCOUNTER — Telehealth (HOSPITAL_COMMUNITY): Payer: Self-pay | Admitting: *Deleted

## 2018-01-22 NOTE — Telephone Encounter (Signed)
Preadmission screen  

## 2018-01-25 ENCOUNTER — Ambulatory Visit (INDEPENDENT_AMBULATORY_CARE_PROVIDER_SITE_OTHER): Payer: Medicaid Other | Admitting: Family Medicine

## 2018-01-25 ENCOUNTER — Ambulatory Visit (INDEPENDENT_AMBULATORY_CARE_PROVIDER_SITE_OTHER): Payer: Medicaid Other | Admitting: *Deleted

## 2018-01-25 ENCOUNTER — Encounter: Payer: Self-pay | Admitting: Family Medicine

## 2018-01-25 VITALS — BP 112/66 | HR 95 | Wt 177.9 lb

## 2018-01-25 DIAGNOSIS — O26893 Other specified pregnancy related conditions, third trimester: Secondary | ICD-10-CM | POA: Diagnosis not present

## 2018-01-25 DIAGNOSIS — Z01812 Encounter for preprocedural laboratory examination: Secondary | ICD-10-CM

## 2018-01-25 DIAGNOSIS — R102 Pelvic and perineal pain: Secondary | ICD-10-CM

## 2018-01-25 DIAGNOSIS — Z3483 Encounter for supervision of other normal pregnancy, third trimester: Secondary | ICD-10-CM

## 2018-01-25 DIAGNOSIS — O3483 Maternal care for other abnormalities of pelvic organs, third trimester: Secondary | ICD-10-CM

## 2018-01-25 DIAGNOSIS — M899 Disorder of bone, unspecified: Secondary | ICD-10-CM

## 2018-01-25 DIAGNOSIS — N8110 Cystocele, unspecified: Secondary | ICD-10-CM

## 2018-01-25 DIAGNOSIS — Z3480 Encounter for supervision of other normal pregnancy, unspecified trimester: Secondary | ICD-10-CM

## 2018-01-25 DIAGNOSIS — Z3A39 39 weeks gestation of pregnancy: Secondary | ICD-10-CM

## 2018-01-25 NOTE — Progress Notes (Signed)
Pt states she has not taken Flexeril or Oxycodone for 4 days. Pt here fro Foley bulb placement. IOL scheduled 1/21 @ midnight

## 2018-01-25 NOTE — Progress Notes (Addendum)
   PRENATAL VISIT NOTE  Subjective:  Kimberly [name] is a 23 y.o. G2P1001 at [redacted]w[redacted]d being seen today for ongoing prenatal care.  She is currently monitored for the following issues for this low-risk pregnancy and has Ovarian cyst; Chronic constipation; Bipolar 1 disorder (HCC); Chlamydia infection affecting pregnancy; Supervision of other normal pregnancy, antepartum; Separation of symphysis pubis during delivery; Pelvic pain affecting pregnancy in third trimester, antepartum; and Cystocele affecting pregnancy in third trimester on their problem list.  Patient reports no complaints.  Contractions: Not present. Vag. Bleeding: None.  Movement: Present. Denies leaking of fluid.   The following portions of the patient's history were reviewed and updated as appropriate: allergies, current medications, past family history, past medical history, past social history, past surgical history and problem list. Problem list updated.  Objective:   Vitals:   01/25/18 0822  BP: 112/66  Pulse: 95  Weight: 177 lb 14.4 oz (80.7 kg)    Fetal Status: Fetal Heart Rate (bpm): NST Fundal Height: 39 cm Movement: Present  Presentation: Vertex  General:  Alert, oriented and cooperative. Patient is in no acute distress.  Skin: Skin is warm and dry. No rash noted.   Cardiovascular: Normal heart rate noted  Respiratory: Normal respiratory effort, no problems with respiration noted  Abdomen: Soft, gravid, appropriate for gestational age.  Pain/Pressure: Present     Pelvic: Cervical exam performed Dilation: 3.5 Effacement (%): 50 Station: Ballotable  Extremities: Normal range of motion.  Edema: None  Mental Status: Normal mood and affect. Normal behavior. Normal judgment and thought content.   Assessment and Plan:  Pregnancy: G2P1001 at [redacted]w[redacted]d  1. Supervision of other normal pregnancy, antepartum FHT and FH normal. Patient scheduled for elective induction tonight at midnight. Was initially scheduled for foley balloon  placement, but 3-4 cm dilated and soft. Bloody show on exam. No need for placement.  2. Pubic bone pain Improved some  3. Cystocele affecting pregnancy in third trimester   4. Pelvic pain affecting pregnancy in third trimester, antepartum   Preterm labor symptoms and general obstetric precautions including but not limited to vaginal bleeding, contractions, leaking of fluid and fetal movement were reviewed in detail with the patient. Please refer to After Visit Summary for other counseling recommendations.  Return in about 4 weeks (around 02/22/2018) for Postpartum Exam.  Future Appointments  Date Time Provider Department Center  01/25/2018  9:15 AM Levie Heritage, DO [facility]  01/26/2018 12:00 AM [facility] RO[facility] None    Levie Heritage, DO

## 2018-01-26 ENCOUNTER — Inpatient Hospital Stay (HOSPITAL_COMMUNITY): Payer: Medicaid Other | Admitting: Anesthesiology

## 2018-01-26 ENCOUNTER — Inpatient Hospital Stay (HOSPITAL_COMMUNITY)
Admission: RE | Admit: 2018-01-26 | Discharge: 2018-01-27 | DRG: 807 | Disposition: A | Discharge status: Home / Self Care | Payer: Medicaid Other | Attending: Family Medicine | Admitting: Family Medicine

## 2018-01-26 ENCOUNTER — Other Ambulatory Visit: Payer: Self-pay

## 2018-01-26 ENCOUNTER — Encounter (HOSPITAL_COMMUNITY): Payer: Self-pay

## 2018-01-26 DIAGNOSIS — Z3480 Encounter for supervision of other normal pregnancy, unspecified trimester: Secondary | ICD-10-CM

## 2018-01-26 DIAGNOSIS — Z3483 Encounter for supervision of other normal pregnancy, third trimester: Secondary | ICD-10-CM | POA: Diagnosis present

## 2018-01-26 DIAGNOSIS — N8110 Cystocele, unspecified: Secondary | ICD-10-CM | POA: Diagnosis present

## 2018-01-26 DIAGNOSIS — A749 Chlamydial infection, unspecified: Secondary | ICD-10-CM

## 2018-01-26 DIAGNOSIS — O3483 Maternal care for other abnormalities of pelvic organs, third trimester: Principal | ICD-10-CM | POA: Diagnosis present

## 2018-01-26 DIAGNOSIS — O98811 Other maternal infectious and parasitic diseases complicating pregnancy, first trimester: Secondary | ICD-10-CM

## 2018-01-26 DIAGNOSIS — R102 Pelvic and perineal pain: Secondary | ICD-10-CM

## 2018-01-26 DIAGNOSIS — O26893 Other specified pregnancy related conditions, third trimester: Secondary | ICD-10-CM

## 2018-01-26 DIAGNOSIS — Z3A39 39 weeks gestation of pregnancy: Secondary | ICD-10-CM | POA: Diagnosis not present

## 2018-01-26 LAB — CBC
HEMATOCRIT: 39 % (ref 36.0–46.0)
Hemoglobin: 12.8 g/dL (ref 12.0–15.0)
MCH: 30.1 pg (ref 26.0–34.0)
MCHC: 32.8 g/dL (ref 30.0–36.0)
MCV: 91.8 fL (ref 80.0–100.0)
Platelets: 232 10*3/uL (ref 150–400)
RBC: 4.25 MIL/uL (ref 3.87–5.11)
RDW: 13.2 % (ref 11.5–15.5)
WBC: 10.4 10*3/uL (ref 4.0–10.5)
nRBC: 0 % (ref 0.0–0.2)

## 2018-01-26 LAB — RPR: RPR Ser Ql: NONREACTIVE

## 2018-01-26 MED ORDER — LIDOCAINE HCL (PF) 1 % IJ SOLN
30.0000 mL | INTRAMUSCULAR | Status: DC | PRN
  Filled 2018-01-26: qty 30

## 2018-01-26 MED ORDER — LACTATED RINGERS IV SOLN
INTRAVENOUS | Status: DC
  Administered 2018-01-26 (×2): via INTRAVENOUS

## 2018-01-26 MED ORDER — ACETAMINOPHEN 325 MG PO TABS: 650.0000 mg | ORAL_TABLET | ORAL | Status: DC | PRN

## 2018-01-26 MED ORDER — EPHEDRINE 5 MG/ML INJ
10.0000 mg | INTRAVENOUS | Status: DC | PRN
  Filled 2018-01-26: qty 2

## 2018-01-26 MED ORDER — IBUPROFEN 600 MG PO TABS
600.0000 mg | ORAL_TABLET | Freq: Four times a day (QID) | ORAL | Status: DC
  Administered 2018-01-26 (×3): 600 mg via ORAL
  Filled 2018-01-26 (×3): qty 1

## 2018-01-26 MED ORDER — ZOLPIDEM TARTRATE 5 MG PO TABS: 5.0000 mg | ORAL_TABLET | Freq: Every evening | ORAL | Status: DC | PRN

## 2018-01-26 MED ORDER — OXYTOCIN BOLUS FROM INFUSION
500.0000 mL | Freq: Once | INTRAVENOUS | Status: AC
  Administered 2018-01-26: 500 mL via INTRAVENOUS

## 2018-01-26 MED ORDER — ONDANSETRON HCL 4 MG/2ML IJ SOLN: 4.0000 mg | INTRAMUSCULAR | Status: DC | PRN

## 2018-01-26 MED ORDER — OXYCODONE-ACETAMINOPHEN 5-325 MG PO TABS: 2.0000 | ORAL_TABLET | ORAL | Status: DC | PRN

## 2018-01-26 MED ORDER — OXYTOCIN 40 UNITS IN NORMAL SALINE INFUSION - SIMPLE MED
1.0000 m[IU]/min | INTRAVENOUS | Status: DC
  Administered 2018-01-26: 2 m[IU]/min via INTRAVENOUS

## 2018-01-26 MED ORDER — OXYCODONE-ACETAMINOPHEN 5-325 MG PO TABS: 1.0000 | ORAL_TABLET | ORAL | Status: DC | PRN

## 2018-01-26 MED ORDER — MEASLES, MUMPS & RUBELLA VAC IJ SOLR
0.5000 mL | Freq: Once | INTRAMUSCULAR | Status: DC
  Filled 2018-01-26: qty 0.5

## 2018-01-26 MED ORDER — PHENYLEPHRINE 40 MCG/ML (10ML) SYRINGE FOR IV PUSH (FOR BLOOD PRESSURE SUPPORT)
80.0000 ug | PREFILLED_SYRINGE | INTRAVENOUS | Status: DC | PRN
  Filled 2018-01-26 (×2): qty 10

## 2018-01-26 MED ORDER — LIDOCAINE HCL (PF) 1 % IJ SOLN
INTRAMUSCULAR | Status: DC | PRN
  Administered 2018-01-26 (×2): 5 mL via EPIDURAL

## 2018-01-26 MED ORDER — ONDANSETRON HCL 4 MG PO TABS: 4.0000 mg | ORAL_TABLET | ORAL | Status: DC | PRN

## 2018-01-26 MED ORDER — ONDANSETRON HCL 4 MG/2ML IJ SOLN
4.0000 mg | Freq: Four times a day (QID) | INTRAMUSCULAR | Status: DC | PRN
  Administered 2018-01-26: 4 mg via INTRAVENOUS
  Filled 2018-01-26: qty 2

## 2018-01-26 MED ORDER — SIMETHICONE 80 MG PO CHEW: 80.0000 mg | CHEWABLE_TABLET | ORAL | Status: DC | PRN

## 2018-01-26 MED ORDER — LACTATED RINGERS IV SOLN
500.0000 mL | INTRAVENOUS | Status: DC | PRN
  Administered 2018-01-26: 1000 mL via INTRAVENOUS

## 2018-01-26 MED ORDER — DIPHENHYDRAMINE HCL 50 MG/ML IJ SOLN: 12.5000 mg | INTRAMUSCULAR | Status: DC | PRN

## 2018-01-26 MED ORDER — WITCH HAZEL-GLYCERIN EX PADS: 1.0000 "application " | MEDICATED_PAD | CUTANEOUS | Status: DC | PRN

## 2018-01-26 MED ORDER — SENNOSIDES-DOCUSATE SODIUM 8.6-50 MG PO TABS
2.0000 | ORAL_TABLET | ORAL | Status: DC
  Administered 2018-01-26: 2 via ORAL
  Filled 2018-01-26: qty 2

## 2018-01-26 MED ORDER — DIPHENHYDRAMINE HCL 25 MG PO CAPS: 25.0000 mg | ORAL_CAPSULE | Freq: Four times a day (QID) | ORAL | Status: DC | PRN

## 2018-01-26 MED ORDER — TERBUTALINE SULFATE 1 MG/ML IJ SOLN
0.2500 mg | Freq: Once | INTRAMUSCULAR | Status: DC | PRN
  Filled 2018-01-26: qty 1

## 2018-01-26 MED ORDER — LACTATED RINGERS IV SOLN
500.0000 mL | Freq: Once | INTRAVENOUS | Status: AC
  Administered 2018-01-26: 500 mL via INTRAVENOUS

## 2018-01-26 MED ORDER — PHENYLEPHRINE 40 MCG/ML (10ML) SYRINGE FOR IV PUSH (FOR BLOOD PRESSURE SUPPORT)
80.0000 ug | PREFILLED_SYRINGE | INTRAVENOUS | Status: DC | PRN
  Administered 2018-01-26 (×2): 80 ug via INTRAVENOUS
  Filled 2018-01-26: qty 10

## 2018-01-26 MED ORDER — COCONUT OIL OIL: 1.0000 "application " | TOPICAL_OIL | Status: DC | PRN

## 2018-01-26 MED ORDER — FENTANYL 2.5 MCG/ML BUPIVACAINE 1/10 % EPIDURAL INFUSION (WH - ANES)
14.0000 mL/h | INTRAMUSCULAR | Status: DC | PRN
  Administered 2018-01-26: 14 mL/h via EPIDURAL
  Filled 2018-01-26: qty 100

## 2018-01-26 MED ORDER — DIBUCAINE 1 % RE OINT: 1.0000 "application " | TOPICAL_OINTMENT | RECTAL | Status: DC | PRN

## 2018-01-26 MED ORDER — TETANUS-DIPHTH-ACELL PERTUSSIS 5-2.5-18.5 LF-MCG/0.5 IM SUSP: 0.5000 mL | Freq: Once | INTRAMUSCULAR | Status: DC

## 2018-01-26 MED ORDER — OXYTOCIN 40 UNITS IN NORMAL SALINE INFUSION - SIMPLE MED: 2.5000 [IU]/h | INTRAVENOUS | Status: DC

## 2018-01-26 MED ORDER — BENZOCAINE-MENTHOL 20-0.5 % EX AERO: 1.0000 "application " | INHALATION_SPRAY | CUTANEOUS | Status: DC | PRN

## 2018-01-26 MED ORDER — SOD CITRATE-CITRIC ACID 500-334 MG/5ML PO SOLN: 30.0000 mL | ORAL | Status: DC | PRN

## 2018-01-26 MED ORDER — PRENATAL MULTIVITAMIN CH
1.0000 | ORAL_TABLET | Freq: Every day | ORAL | Status: DC
  Administered 2018-01-26: 1 via ORAL
  Filled 2018-01-26: qty 1

## 2018-01-26 NOTE — Anesthesia Preprocedure Evaluation (Signed)
Anesthesia Evaluation  Patient identified by MRN, date of birth, ID band Patient awake    Reviewed: Allergy & Precautions, NPO status , Patient's Chart, lab work & pertinent test results  Airway Mallampati: II  TM Distance: >3 FB Neck ROM: Full    Dental no notable dental hx. (+) Dental Advisory Given   Pulmonary neg pulmonary ROS,    Pulmonary exam normal        Cardiovascular negative cardio ROS Normal cardiovascular exam     Neuro/Psych negative neurological ROS  negative psych ROS   GI/Hepatic negative GI ROS, Neg liver ROS,   Endo/Other  negative endocrine ROS  Renal/GU negative Renal ROS  negative genitourinary   Musculoskeletal negative musculoskeletal ROS (+)   Abdominal   Peds negative pediatric ROS (+)  Hematology negative hematology ROS (+)   Anesthesia Other Findings   Reproductive/Obstetrics (+) Pregnancy                             Anesthesia Physical Anesthesia Plan  ASA: II  Anesthesia Plan: Epidural   Post-op Pain Management:    Induction:   PONV Risk Score and Plan:   Airway Management Planned: Natural Airway  Additional Equipment:   Intra-op Plan:   Post-operative Plan:   Informed Consent: I have reviewed the patients History and Physical, chart, labs and discussed the procedure including the risks, benefits and alternatives for the proposed anesthesia with the patient or authorized representative who has indicated his/her understanding and acceptance.     Dental advisory given  Plan Discussed with: Anesthesiologist  Anesthesia Plan Comments:         Anesthesia Quick Evaluation  

## 2018-01-26 NOTE — Anesthesia Pain Management Evaluation Note (Signed)
  [facility] Pain Management Visit Note  Patient: Kimberly [name], 23 y.o., female  "Hello I am a member of the anesthesia team at [facility]. We have an anesthesia team available at all times to provide care throughout the hospital, including epidural management and anesthesia for C-section. I don't know your plan for the delivery whether it a natural birth, water birth, IV sedation, nitrous supplementation, doula or epidural, but we want to meet your pain goals."   1.Was your pain managed to your expectations on prior hospitalizations?   Yes   2.What is your expectation for pain management during this hospitalization?     Epidural  3.How can we help you reach that goal? Epidural in place  Record the patient's initial score and the patient's pain goal.   Pain: 7  Pain Goal: 7 [facility] wants you to be able to say your pain was always managed very well.  Shanavia Makela 01/26/2018

## 2018-01-26 NOTE — Progress Notes (Signed)
LABOR PROGRESS NOTE  Kimberly [name] is a 23 y.o. G2P1001 at [redacted]w[redacted]d  admitted for elective IOL.   Subjective: Patient without complaints. Support at bedside.   Objective: BP (!) 82/56   Pulse 82   Temp 97.8 F (36.6 C) (Oral)   Resp 16   Ht 5\' 5"  (1.651 m)   Wt 81.1 kg   LMP 04/27/2017   BMI 29.74 kg/m  or  Vitals:   01/26/18 0130 01/26/18 0200 01/26/18 0230 01/26/18 0300  BP: 103/60 114/70 107/63 (!) 82/56  Pulse: 76 76 82 82  Resp:  16 14 16   Temp:      TempSrc:      Weight:      Height:        Dilation: 6 Effacement (%): 50 Station: -1 Presentation: Vertex Exam by:: Paris Lore RN FHT: baseline rate 140, moderate varibility, +acel, no decel Toco: q2-4 min   Labs: Lab Results  Component Value Date   WBC 10.4 01/26/2018   HGB 12.8 01/26/2018   HCT 39.0 01/26/2018   MCV 91.8 01/26/2018   PLT 232 01/26/2018    Patient Active Problem List   Diagnosis Date Noted  . Indication for care in labor and delivery, antepartum 01/26/2018  . Cystocele affecting pregnancy in third trimester 01/08/2018  . Pelvic pain affecting pregnancy in third trimester, antepartum 12/28/2017  . Separation of symphysis pubis during delivery 12/24/2017  . Supervision of other normal pregnancy, antepartum 08/10/2017  . Chlamydia infection affecting pregnancy 07/08/2017  . Bipolar 1 disorder (HCC) 07/05/2014  . Chronic constipation 11/11/2011  . Ovarian cyst     Assessment / Plan: 23 y.o. G2P1001 at [redacted]w[redacted]d here for elective IOL.   Labor: Induction. On pitocin at 6 mu/min with good cervical change. SROM with clear fluid.  Fetal Wellbeing:  Cat I  Pain Control:  Maternal support  Anticipated MOD:  NSVD   Marcy Siren, D.O. OB Fellow  01/26/2018, 3:51 AM

## 2018-01-26 NOTE — Anesthesia Procedure Notes (Signed)
Epidural Patient location during procedure: OB Start time: 01/26/2018 4:46 AM End time: 01/26/2018 5:02 AM  Staffing Anesthesiologist: Heather Roberts, MD Performed: anesthesiologist   Preanesthetic Checklist Completed: patient identified, site marked, pre-op evaluation, timeout performed, IV checked, risks and benefits discussed and monitors and equipment checked  Epidural Patient position: sitting Prep: DuraPrep Patient monitoring: heart rate, cardiac monitor, continuous pulse ox and blood pressure Approach: midline Location: L2-L3 Injection technique: LOR saline  Needle:  Needle type: Tuohy  Needle gauge: 17 G Needle length: 9 cm Needle insertion depth: 5 cm Catheter size: 20 Guage Catheter at skin depth: 10 cm Test dose: negative and Other  Assessment Events: blood not aspirated, injection not painful, no injection resistance and negative IV test  Additional Notes Informed consent obtained prior to proceeding including risk of failure, 1% risk of PDPH, risk of minor discomfort and bruising.  Discussed rare but serious complications including epidural abscess, permanent nerve injury, epidural hematoma.  Discussed alternatives to epidural analgesia and patient desires to proceed.  Timeout performed pre-procedure verifying patient name, procedure, and platelet count.  Patient tolerated procedure well.

## 2018-01-26 NOTE — H&P (Addendum)
LABOR AND DELIVERY ADMISSION HISTORY AND PHYSICAL NOTE  Kimberly [name] is a 23 y.o. female G2P1001 with IUP at [redacted]w[redacted]d by 9 wk sono presenting for elective IOL.  She reports positive fetal movement. She denies leakage of fluid or vaginal bleeding.  Prenatal History/Complications: PNC at [facility] Pregnancy complications:  - Chlamydia in pregnancy, TOC neg 8/5  -separation of symphysis pubis  -cystocele affecting pregnancy  -Bipolar I disorder  -?h/o PPH   Past Medical History: Past Medical History:  Diagnosis Date  . Bipolar 1 disorder (HCC)   . Constipation   . Enteritis   . No pertinent past medical history   . Ovarian cyst     Past Surgical History: Past Surgical History:  Procedure Laterality Date  . TONSILLECTOMY      Obstetrical History: OB History    Gravida  2   Para  1   Term  1   Preterm  0   AB  0   Living  1     SAB  0   TAB  0   Ectopic  0   Multiple  0   Live Births  1           Social History: Social History   Socioeconomic History  . Marital status: Married    Spouse name: Not on file  . Number of children: Not on file  . Years of education: Not on file  . Highest education level: Not on file  Occupational History  . Not on file  Social Needs  . Financial resource strain: Not on file  . Food insecurity:    Worry: Never true    Inability: Never true  . Transportation needs:    Medical: No    Non-medical: No  Tobacco Use  . Smoking status: Never Smoker  . Smokeless tobacco: Never Used  Substance and Sexual Activity  . Alcohol use: No  . Drug use: No  . Sexual activity: Not Currently    Birth control/protection: None  Lifestyle  . Physical activity:    Days per week: Not on file    Minutes per session: Not on file  . Stress: Not on file  Relationships  . Social connections:    Talks on phone: Not on file    Gets together: Not on file    Attends religious service: Not on file    Active member of club or organization:  Not on file    Attends meetings of clubs or organizations: Not on file    Relationship status: Not on file  Other Topics Concern  . Not on file  Social History Narrative   10th grade    Family History: Family History  Problem Relation Age of Onset  . Asthma Brother   . Hypertension Mother   . Diabetes Paternal Uncle   . Diabetes Maternal Grandmother   . Cancer Maternal Grandmother   . Diabetes Maternal Grandfather   . Cancer Maternal Grandfather   . Stroke Maternal Grandfather   . Hirschsprung's disease Neg Hx     Allergies: No Known Allergies  Medications Prior to Admission  Medication Sig Dispense Refill Last Dose  . cyclobenzaprine (FLEXERIL) 10 MG tablet Take 1 tablet (10 mg total) by mouth every 8 (eight) hours as needed for muscle spasms. (Patient not taking: Reported on 01/25/2018) 30 tablet 1 Not Taking  . Ferrous Fumarate (HEMOCYTE - 106 MG FE) 324 (106 Fe) MG TABS tablet Take 1 tablet by mouth.   Taking  .  oxyCODONE-acetaminophen (PERCOCET/ROXICET) 5-325 MG tablet Take 1-2 tablets by mouth every 4 (four) hours as needed for severe pain. (Patient not taking: Reported on 01/14/2018) 10 tablet 0 Not Taking  . Prenatal Vit-Fe Fumarate-FA (PRENATAL MULTIVITAMIN) TABS tablet Take 1 tablet by mouth daily at 12 noon.   Taking     Review of Systems  All systems reviewed and negative except as stated in HPI  Physical Exam Blood pressure 107/66, pulse 78, temperature 97.8 F (36.6 C), temperature source Oral, resp. rate 16, height 5\' 5"  (1.651 m), weight 81.1 kg, last menstrual period 04/27/2017, unknown if currently breastfeeding. General appearance: alert, oriented, NAD Lungs: normal respiratory effort Heart: regular rate Abdomen: soft, non-tender; gravid, FH appropriate for GA Extremities: No calf swelling or tenderness Presentation: cephalic Fetal monitoring: 130 bpm, moderate variability, +acels, no decels  Uterine activity: Irregular     Prenatal labs: ABO, Rh:  O/Positive/-- (08/05 1348) Antibody: Negative (08/05 1348) Rubella: 9.99 (08/05 1348) RPR: Non Reactive (10/31 0833)  HBsAg: Negative (08/05 1348)  HIV: Non Reactive (10/31 0833)  GC/Chlamydia: Negative  GBS: Negative (01/02 0000)  2-hr GTT: Normal  Genetic screening:  Declined  Anatomy US: Normal   Prenatal Transfer Tool  Maternal Diabetes: No Genetic Screening: Declined Maternal Ultrasounds/Referrals: Normal Fetal Ultrasounds or other Referrals:  None Maternal Substance Abuse:  No Significant Maternal Medications:  None Significant Maternal Lab Results: Lab values include: Group B Strep negative  Results for orders placed or performed during the hospital encounter of 01/26/18 (from the past 24 hour(s))  CBC   Collection Time: 01/26/18 12:40 AM  Result Value Ref Range   WBC 10.4 4.0 - 10.5 K/uL   RBC 4.25 3.87 - 5.11 MIL/uL   Hemoglobin 12.8 12.0 - 15.0 g/dL   HCT 16.139.0 09.636.0 - 04.546.0 %   MCV 91.8 80.0 - 100.0 fL   MCH 30.1 26.0 - 34.0 pg   MCHC 32.8 30.0 - 36.0 g/dL   RDW 40.913.2 81.111.5 - 91.415.5 %   Platelets 232 150 - 400 K/uL   nRBC 0.0 0.0 - 0.2 %    Patient Active Problem List   Diagnosis Date Noted  . Indication for care in labor and delivery, antepartum 01/26/2018  . Cystocele affecting pregnancy in third trimester 01/08/2018  . Pelvic pain affecting pregnancy in third trimester, antepartum 12/28/2017  . Separation of symphysis pubis during delivery 12/24/2017  . Supervision of other normal pregnancy, antepartum 08/10/2017  . Chlamydia infection affecting pregnancy 07/08/2017  . Bipolar 1 disorder (HCC) 07/05/2014  . Chronic constipation 11/11/2011  . Ovarian cyst     Assessment: Kimberly [name] is a 23 y.o. G2P1001 at 1681w1d here for elective IOL.   #Labor: Will start induction with Pitocin given favorable cervix.  #Pain: Undecided about epidural.  #FWB: Cat I.  #ID:  GBS neg  #MOF: Breast  #MOC:POPs  #Circ:  N/A   De Hollingsheadatherine L Wallace 01/26/2018, 12:57 AM

## 2018-01-26 NOTE — Anesthesia Postprocedure Evaluation (Signed)
Anesthesia Post Note  Patient: Kimberly [name]  Procedure(s) Performed: AN AD HOC LABOR EPIDURAL     Patient location during evaluation: Mother Baby Anesthesia Type: Epidural Level of consciousness: awake and alert Pain management: pain level controlled Vital Signs Assessment: post-procedure vital signs reviewed and stable Respiratory status: spontaneous breathing, nonlabored ventilation and respiratory function stable Cardiovascular status: stable Postop Assessment: no headache, no backache and epidural receding Anesthetic complications: no    Last Vitals:  Vitals:   01/26/18 1540 01/26/18 1920  BP: 104/61 110/75  Pulse: 77 75  Resp: 17   Temp: 36.7 C 36.8 C  SpO2:      Last Pain:  Vitals:   01/26/18 1936  TempSrc:   PainSc: 0-No pain   Pain Goal: Patients Stated Pain Goal: 5 (01/26/18 0435)                 Marrion Coy

## 2018-01-26 NOTE — Lactation Note (Signed)
This note was copied from a baby's chart. Lactation Consultation Note  Patient Name: Kimberly [name] [id number] Date: 01/26/2018 Reason for consult: Initial assessment  Visited with P2 Mom of term baby at 45 hrs old.  Mom is pleased that this baby latched right after birth, and has been latching well for several feedings already.    Hearing screen just finished.  Baby swaddled, but cueing. Unwrapped baby and placed baby skin to skin on Mom.  Mom assisted with using cross cradle rather than cradle hold.  Baby opens and latches with ease.  Mom taught to use alternate breast compression to increase milk transfer.  A few swallows identified.    Encouraged continued STS and feeding baby often on cue.  Goal of 8-12 feedings per 24 hrs.    Lactation brochure provided.  Mom aware of IP and OP lactation support available.  Left Mom breastfeeding her baby in cross cradle hold.   Maternal Data Formula Feeding for Exclusion: No Has patient been taught Hand Expression?: Yes Does the patient have breastfeeding experience prior to this delivery?: Yes  Feeding Feeding Type: Breast Fed  LATCH Score Latch: Grasps breast easily, tongue down, lips flanged, rhythmical sucking.  Audible Swallowing: A few with stimulation  Type of Nipple: Everted at rest and after stimulation  Comfort (Breast/Nipple): Soft / non-tender  Hold (Positioning): Assistance needed to correctly position infant at breast and maintain latch.  LATCH Score: 8  Interventions Interventions: Breast feeding basics reviewed;Assisted with latch;Skin to skin;Breast massage;Hand express;Breast compression;Adjust position;Support pillows;Position options Consult Status Consult Status: Follow-up Date: 01/27/18 Follow-up type: In-patient    Judee Clara 01/26/2018, 6:35 PM

## 2018-01-27 MED ORDER — IBUPROFEN 600 MG PO TABS: 600.0000 mg | ORAL_TABLET | Freq: Four times a day (QID) | ORAL | 0 refills | Status: DC

## 2018-01-27 MED ORDER — NORETHINDRONE 0.35 MG PO TABS: 1.0000 | ORAL_TABLET | Freq: Every day | ORAL | 11 refills | Status: DC

## 2018-01-27 NOTE — Discharge Summary (Addendum)
OB Discharge Summary     Patient Name: Kimberly [name] DOB: 04-02-1995 MRN: [id number]  Date of admission: 01/26/2018 Delivering MD: Arvilla Market   Date of discharge: 01/27/2018  Admitting diagnosis: INDUCTION Intrauterine pregnancy: [redacted]w[redacted]d     Secondary diagnosis:  Active Problems:   Indication for care in labor and delivery, antepartum   Vaginal delivery  Additional problems: none     Discharge diagnosis: Term Pregnancy Delivered                                                                                                Post partum procedures:none  Augmentation: AROM and Pitocin  Complications: None  Hospital course:  Induction of Labor With Vaginal Delivery   23 y.o. yo [id number] at [redacted]w[redacted]d was admitted to the hospital 01/26/2018 for induction of labor.  Indication for induction: Favorable cervix at term.  Patient had an uncomplicated labor course as follows: Membrane Rupture Time/Date: 3:40 AM ,01/26/2018   Intrapartum Procedures: Episiotomy: None [1]                                         Lacerations:  None [1]  Patient had delivery of a Viable infant.  Information for the patient's newborn:  Normie, Birks [[id number]]  Delivery Method: Vag-Spont   01/26/2018  Details of delivery can be found in separate delivery note.  Patient had a routine postpartum course. Patient is discharged home 01/27/18.  Physical exam  Vitals:   01/26/18 1540 01/26/18 1920 01/26/18 2340 01/27/18 0525  BP: 104/61 110/75 112/66 (!) 103/55  Pulse: 77 75 80 74  Resp: 17 17 17 16   Temp: 98 F (36.7 C) 98.2 F (36.8 C) 98.1 F (36.7 C) 98.2 F (36.8 C)  TempSrc: Oral Oral Oral Oral  SpO2:  99%    Weight:      Height:       General: alert, cooperative and no distress Lochia: appropriate Uterine Fundus: firm Incision: N/A DVT Evaluation: No evidence of DVT seen on physical exam. Negative Homan's sign. No cords or calf tenderness. Labs: Lab Results  Component Value  Date   WBC 10.4 01/26/2018   HGB 12.8 01/26/2018   HCT 39.0 01/26/2018   MCV 91.8 01/26/2018   PLT 232 01/26/2018   CMP Latest Ref Rng & Units 07/03/2017  Glucose 70 - 99 mg/dL 567(O)  BUN 6 - 20 mg/dL <1(I)  Creatinine 1.03 - 1.00 mg/dL 0.13  Sodium 143 - 888 mmol/L 136  Potassium 3.5 - 5.1 mmol/L 3.6  Chloride 98 - 111 mmol/L 105  CO2 22 - 32 mmol/L 23  Calcium 8.9 - 10.3 mg/dL 9.0  Total Protein 6.5 - 8.1 g/dL 6.6  Total Bilirubin 0.3 - 1.2 mg/dL 0.8  Alkaline Phos 38 - 126 U/L 52  AST 15 - 41 U/L 14(L)  ALT 0 - 44 U/L 10    Discharge instruction: per After Visit Summary and "Baby and Me Booklet".  After visit meds:  Allergies as of  01/27/2018   No Known Allergies     Medication List    STOP taking these medications   cyclobenzaprine 10 MG tablet Commonly known as:  FLEXERIL     TAKE these medications   Ferrous Fumarate 324 (106 Fe) MG Tabs tablet Commonly known as:  HEMOCYTE - 106 mg FE Take 1 tablet by mouth.   ibuprofen 600 MG tablet Commonly known as:  ADVIL,MOTRIN Take 1 tablet (600 mg total) by mouth every 6 (six) hours.   norethindrone 0.35 MG tablet Commonly known as:  MICRONOR,CAMILA,ERRIN Take 1 tablet (0.35 mg total) by mouth daily.   prenatal multivitamin Tabs tablet Take 1 tablet by mouth daily at 12 noon.       Diet: routine diet  Activity: Advance as tolerated. Pelvic rest for 6 weeks.   Outpatient follow up:4 weeks Follow up Appt: Future Appointments  Date Time Provider Department Center  02/23/2018  1:15 PM Marylene Land, CNM [facility]   Follow up Visit:No follow-ups on file.  Postpartum contraception: Progesterone only pills  Newborn Data: Live born female  Birth Weight: 6 lb 15.3 oz (3155 g) APGAR: 8, 9  Newborn Delivery   Birth date/time:  01/26/2018 08:14:00 Delivery type:  Vaginal, Spontaneous     Baby Feeding: Breast Disposition:home with mother   01/27/2018 Levie Heritage, DO

## 2018-01-27 NOTE — Clinical Social Work Maternal (Signed)
CLINICAL SOCIAL WORK MATERNAL/CHILD NOTE  Patient Details  Name: Kimberly [name] MRN: [id number] Date of Birth: 01/15/1995  Date:  April 17, 2018  Clinical Social Worker Initiating Note:  Laurey Arrow Date/Time: Initiated:  01/27/18/1017     Child's Name:  Kimberly [name]   Biological Parents:  Mother, Father   Need for Interpreter:  None   Reason for Referral:  Behavioral Health Concerns(hx of Bipolar disorder)   Address:  [place] [address]     Phone number:  [phone] (home)     Additional phone number:   Household Members/Support Persons (HM/SP):   Household Member/Support Person 1, Household Member/Support Person 2   HM/SP Name Relationship DOB or Age  HM/SP -[place] [name]/Husband 05/25/93  HM/SP -2 Kimberly [name] daughter 12/07/2014  HM/SP -3        HM/SP -4        HM/SP -5        HM/SP -6        HM/SP -7        HM/SP -8          Natural Supports (not living in the home):  Extended Family, Immediate Family, Parent([name] also reported that [name]'s family will also provide support if needed. )   Professional Supports: None   Employment: Unemployed   Type of Work:     Education:  Programmer, systems   Homebound arranged:    Museum/gallery curator Resources:  [employer]   Other Resources:  Physicist, medical    Cultural/Religious Considerations Which May Impact Care:  Per [employer] Sheet, [name] religous preference is Non-Denominational.   Strengths:  Ability to meet basic needs , Home prepared for child , Understanding of illness, Pediatrician chosen   Psychotropic Medications:         Pediatrician:    [facility]  Pediatrician List:   [place]  [place]      Pediatrician Fax Number:    Risk Factors/Current Problems:  None   Cognitive State:  [name] to Concentrate , Alert , Linear Thinking , Insightful , Goal  Oriented    Mood/Affect:  Happy , Interested , Relaxed , Bright    CSW Assessment: CSW met with in room 115 to complete an assessment for MH hx.  When CSW arrived, [name] was bonding with infant as evidence by engaging in breastfeeding.  [name] was also present and with [name]'s permission, CSW asked [name] to leave the room in order to meet with [name] in private.  [name] was polite, forthcoming, and receptive to meeting with CSW.   CSW asked about [name]'s MH hx and [name] acknowledged a dx of Bipolar disorder however reported that [name] not think it is an acute dx.  [name] stated, "I was acting out as teen and I really think it was my poor behaviors that got me the bipolar dx."  [name] reported [name] has had not symptoms in over 3 years and declined taking medications.  CSW provided education regarding the baby blues period vs. perinatal mood disorders, discussed treatment and gave resources for mental health follow up if concerns arise.  CSW recommends self-evaluation during the postpartum time period using the New Mom Checklist from Postpartum Progress and encouraged [name] to contact a medical professional if symptoms are noted at any time.  CSW assessed for safety and [name] denied SI, HI, and DV.  [name] reported feeling comfortable seeking help if needed.   CSW identifies no further need for intervention and no barriers to discharge at this time.  CSW Plan/Description:  No Further Intervention Required/No Barriers to Discharge, Sudden Infant Death Syndrome (SIDS) Education, Perinatal Mood and Anxiety Disorder (PMADs) Education, Other Information/Referral to Community Resources   Krithi Bray Boyd-Gilyard, MSW, LCSW Clinical Social Work [phone]   Rannie Craney D BOYD-GILYARD, LCSW 01/27/2018, 12:22 PM 

## 2018-01-27 NOTE — Discharge Instructions (Signed)
Third Trimester of Pregnancy The third trimester is from week 28 through week 40 (months 7 through 9). The third trimester is a time when the unborn baby (fetus) is growing rapidly. At the end of the ninth month, the fetus is about 20 inches in length and weighs 6-10 pounds. Body changes during your third trimester Your body will continue to go through many changes during pregnancy. The changes vary from woman to woman. During the third trimester:  Your weight will continue to increase. You can expect to gain 25-35 pounds (11-16 kg) by the end of the pregnancy.  You may begin to get stretch marks on your hips, abdomen, and breasts.  You may urinate more often because the fetus is moving lower into your pelvis and pressing on your bladder.  You may develop or continue to have heartburn. This is caused by increased hormones that slow down muscles in the digestive tract.  You may develop or continue to have constipation because increased hormones slow digestion and cause the muscles that push waste through your intestines to relax.  You may develop hemorrhoids. These are swollen veins (varicose veins) in the rectum that can itch or be painful.  You may develop swollen, bulging veins (varicose veins) in your legs.  You may have increased body aches in the pelvis, back, or thighs. This is due to weight gain and increased hormones that are relaxing your joints.  You may have changes in your hair. These can include thickening of your hair, rapid growth, and changes in texture. Some women also have hair loss during or after pregnancy, or hair that feels dry or thin. Your hair will most likely return to normal after your baby is born.  Your breasts will continue to grow and they will continue to become tender. A yellow fluid (colostrum) may leak from your breasts. This is the first milk you are producing for your baby.  Your belly button may stick out.  You may notice more swelling in your hands,  face, or ankles.  You may have increased tingling or numbness in your hands, arms, and legs. The skin on your belly may also feel numb.  You may feel short of breath because of your expanding uterus.  You may have more problems sleeping. This can be caused by the size of your belly, increased need to urinate, and an increase in your body's metabolism.  You may notice the fetus "dropping," or moving lower in your abdomen (lightening).  You may have increased vaginal discharge.  You may notice your joints feel loose and you may have pain around your pelvic bone. What to expect at prenatal visits You will have prenatal exams every 2 weeks until week 36. Then you will have weekly prenatal exams. During a routine prenatal visit:  You will be weighed to make sure you and the baby are growing normally.  Your blood pressure will be taken.  Your abdomen will be measured to track your baby's growth.  The fetal heartbeat will be listened to.  Any test results from the previous visit will be discussed.  You may have a cervical check near your due date to see if your cervix has softened or thinned (effaced).  You will be tested for Group B streptococcus. This happens between 35 and 37 weeks. Your health care provider may ask you:  What your birth plan is.  How you are feeling.  If you are feeling the baby move.  If you have had any abnormal   symptoms, such as leaking fluid, bleeding, severe headaches, or abdominal cramping.  If you are using any tobacco products, including cigarettes, chewing tobacco, and electronic cigarettes.  If you have any questions. Other tests or screenings that may be performed during your third trimester include:  Blood tests that check for low iron levels (anemia).  Fetal testing to check the health, activity level, and growth of the fetus. Testing is done if you have certain medical conditions or if there are problems during the pregnancy.  Nonstress test  (NST). This test checks the health of your baby to make sure there are no signs of problems, such as the baby not getting enough oxygen. During this test, a belt is placed around your belly. The baby is made to move, and its heart rate is monitored during movement. What is false labor? False labor is a condition in which you feel small, irregular tightenings of the muscles in the womb (contractions) that usually go away with rest, changing position, or drinking water. These are called Braxton Hicks contractions. Contractions may last for hours, days, or even weeks before true labor sets in. If contractions come at regular intervals, become more frequent, increase in intensity, or become painful, you should see your health care provider. What are the signs of labor?  Abdominal cramps.  Regular contractions that start at 10 minutes apart and become stronger and more frequent with time.  Contractions that start on the top of the uterus and spread down to the lower abdomen and back.  Increased pelvic pressure and dull back pain.  A watery or bloody mucus discharge that comes from the vagina.  Leaking of amniotic fluid. This is also known as your "water breaking." It could be a slow trickle or a gush. Let your health care provider know if it has a color or strange odor. If you have any of these signs, call your health care provider right away, even if it is before your due date. Follow these instructions at home: Medicines  Follow your health care provider's instructions regarding medicine use. Specific medicines may be either safe or unsafe to take during pregnancy.  Take a prenatal vitamin that contains at least 600 micrograms (mcg) of folic acid.  If you develop constipation, try taking a stool softener if your health care provider approves. Eating and drinking   Eat a balanced diet that includes fresh fruits and vegetables, whole grains, good sources of protein such as meat, eggs, or tofu,  and low-fat dairy. Your health care provider will help you determine the amount of weight gain that is right for you.  Avoid raw meat and uncooked cheese. These carry germs that can cause birth defects in the baby.  If you have low calcium intake from food, talk to your health care provider about whether you should take a daily calcium supplement.  Eat four or five small meals rather than three large meals a day.  Limit foods that are high in fat and processed sugars, such as fried and sweet foods.  To prevent constipation: ? Drink enough fluid to keep your urine clear or pale yellow. ? Eat foods that are high in fiber, such as fresh fruits and vegetables, whole grains, and beans. Activity  Exercise only as directed by your health care provider. Most women can continue their usual exercise routine during pregnancy. Try to exercise for 30 minutes at least 5 days a week. Stop exercising if you experience uterine contractions.  Avoid heavy lifting.  Do   not exercise in extreme heat or humidity, or at high altitudes.  Wear low-heel, comfortable shoes.  Practice good posture.  You may continue to have sex unless your health care provider tells you otherwise. Relieving pain and discomfort  Take frequent breaks and rest with your legs elevated if you have leg cramps or low back pain.  Take warm sitz baths to soothe any pain or discomfort caused by hemorrhoids. Use hemorrhoid cream if your health care provider approves.  Wear a good support bra to prevent discomfort from breast tenderness.  If you develop varicose veins: ? Wear support pantyhose or compression stockings as told by your healthcare provider. ? Elevate your feet for 15 minutes, 3-4 times a day. Prenatal care  Write down your questions. Take them to your prenatal visits.  Keep all your prenatal visits as told by your health care provider. This is important. Safety  Wear your seat belt at all times when driving.  Make  a list of emergency phone numbers, including numbers for family, friends, the hospital, and police and fire departments. General instructions  Avoid cat litter boxes and soil used by cats. These carry germs that can cause birth defects in the baby. If you have a cat, ask someone to clean the litter box for you.  Do not travel far distances unless it is absolutely necessary and only with the approval of your health care provider.  Do not use hot tubs, steam rooms, or saunas.  Do not drink alcohol.  Do not use any products that contain nicotine or tobacco, such as cigarettes and e-cigarettes. If you need help quitting, ask your health care provider.  Do not use any medicinal herbs or unprescribed drugs. These chemicals affect the formation and growth of the baby.  Do not douche or use tampons or scented sanitary pads.  Do not cross your legs for long periods of time.  To prepare for the arrival of your baby: ? Take prenatal classes to understand, practice, and ask questions about labor and delivery. ? Make a trial run to the hospital. ? Visit the hospital and tour the maternity area. ? Arrange for maternity or paternity leave through employers. ? Arrange for family and friends to take care of pets while you are in the hospital. ? Purchase a rear-facing car seat and make sure you know how to install it in your car. ? Pack your hospital bag. ? Prepare the baby's nursery. Make sure to remove all pillows and stuffed animals from the baby's crib to prevent suffocation.  Visit your dentist if you have not gone during your pregnancy. Use a soft toothbrush to brush your teeth and be gentle when you floss. Contact a health care provider if:  You are unsure if you are in labor or if your water has broken.  You become dizzy.  You have mild pelvic cramps, pelvic pressure, or nagging pain in your abdominal area.  You have lower back pain.  You have persistent nausea, vomiting, or  diarrhea.  You have an unusual or bad smelling vaginal discharge.  You have pain when you urinate. Get help right away if:  Your water breaks before 37 weeks.  You have regular contractions less than 5 minutes apart before 37 weeks.  You have a fever.  You are leaking fluid from your vagina.  You have spotting or bleeding from your vagina.  You have severe abdominal pain or cramping.  You have rapid weight loss or weight gain.  You have   shortness of breath with chest pain.  You notice sudden or extreme swelling of your face, hands, ankles, feet, or legs.  Your baby makes fewer than 10 movements in 2 hours.  You have severe headaches that do not go away when you take medicine.  You have vision changes. Summary  The third trimester is from week 28 through week 40, months 7 through 9. The third trimester is a time when the unborn baby (fetus) is growing rapidly.  During the third trimester, your discomfort may increase as you and your baby continue to gain weight. You may have abdominal, leg, and back pain, sleeping problems, and an increased need to urinate.  During the third trimester your breasts will keep growing and they will continue to become tender. A yellow fluid (colostrum) may leak from your breasts. This is the first milk you are producing for your baby.  False labor is a condition in which you feel small, irregular tightenings of the muscles in the womb (contractions) that eventually go away. These are called Braxton Hicks contractions. Contractions may last for hours, days, or even weeks before true labor sets in.  Signs of labor can include: abdominal cramps; regular contractions that start at 10 minutes apart and become stronger and more frequent with time; watery or bloody mucus discharge that comes from the vagina; increased pelvic pressure and dull back pain; and leaking of amniotic fluid. This information is not intended to replace advice given to you by your  health care provider. Make sure you discuss any questions you have with your health care provider. Document Released: 12/17/2000 Document Revised: 01/29/2016 Document Reviewed: 01/29/2016 Elsevier Interactive Patient Education  2019 [employer]  

## 2018-01-27 NOTE — Lactation Note (Signed)
This note was copied from a baby's chart. Lactation Consultation Note  Patient Name: Girl Senora Ketchum [id number] Date: 01/27/2018 Reason for consult: Follow-up assessment;Term;Infant weight loss  Baby is 46 hours old  LC reviewed and updated the doc flow sheets per mom  Baby recently fed for 30 mins and per mom has been cluster feeding this am  Per mom breast are feeling fuller and heavier and nipples sensitive.  LC instructed mom on the use of shells, comfort gels, and hand pump.  Per mom has a DEBP / Medela.  Mother informed of post-discharge support and given phone number to the lactation department, including services for phone call assistance; out-patient appointments; and breastfeeding support group. List of other breastfeeding resources in the community given in the handout. Encouraged mother to call for problems or concerns related to breastfeeding.   Maternal Data Has patient been taught Hand Expression?: Yes(mom able to express well )  Feeding Feeding Type: (baby recently  fed )  LATCH Score                   Interventions Interventions: Breast feeding basics reviewed;Hand pump;Shells;Comfort gels  Lactation Tools Discussed/Used Tools: Shells;Pump;Comfort gels Shell Type: Inverted Breast pump type: Manual Pump Review: Milk Storage;Setup, frequency, and cleaning Initiated by:: MAI  Date initiated:: 01/27/18   Consult Status Consult Status: Complete Date: 01/27/18    Kathrin Greathouse 01/27/2018, 11:47 AM

## 2018-01-28 ENCOUNTER — Encounter: Payer: Self-pay | Admitting: Obstetrics and Gynecology

## 2018-01-30 LAB — TYPE AND SCREEN
ABO/RH(D): O POS
Antibody Screen: POSITIVE
Unit division: 0
Unit division: 0

## 2018-01-30 LAB — BPAM RBC
Blood Product Expiration Date: 202002102359
Blood Product Expiration Date: 202002102359
UNIT TYPE AND RH: 5100
Unit Type and Rh: 5100

## 2018-02-23 ENCOUNTER — Encounter: Payer: Self-pay | Admitting: Student

## 2018-02-23 ENCOUNTER — Ambulatory Visit (INDEPENDENT_AMBULATORY_CARE_PROVIDER_SITE_OTHER): Payer: Medicaid Other | Admitting: Student

## 2018-02-23 DIAGNOSIS — Z1389 Encounter for screening for other disorder: Secondary | ICD-10-CM | POA: Diagnosis not present

## 2018-02-23 DIAGNOSIS — N816 Rectocele: Secondary | ICD-10-CM

## 2018-02-23 DIAGNOSIS — N8110 Cystocele, unspecified: Secondary | ICD-10-CM

## 2018-02-23 DIAGNOSIS — N819 Female genital prolapse, unspecified: Secondary | ICD-10-CM | POA: Insufficient documentation

## 2018-02-23 NOTE — Patient Instructions (Signed)
Pelvic Floor Dysfunction    Pelvic floor dysfunction (PFD) is a condition that results when the group of muscles and connective tissues that support the organs in the pelvis (pelvic floor muscles) do not work well. These muscles and their connections form a sling that supports the colon and bladder. In men, these muscles also support the prostate gland. In women, they also support the uterus.  PFD causes pelvic floor muscles to be too weak, too tight, or a combination of both. In PFD, muscle movements are not coordinated. This condition may cause bowel or bladder problems. It may also cause pain.  What are the causes?  This condition may be caused by an injury to the pelvic area or by a weakening of pelvic muscles. This often results from pregnancy and childbirth or other types of strain. In many cases, the exact cause is not known.  What increases the risk?  The following factors may make you more likely to develop this condition:  · Having a condition of chronic bladder tissue inflammation (interstitial cystitis).  · Being an older person.  · Being overweight.  · Radiation treatment for cancer in the pelvic region.  · Previous pelvic surgery, such as removal of the uterus (hysterectomy) or prostate gland (prostatectomy).  What are the signs or symptoms?  Symptoms of this condition vary and may include:  · Bladder symptoms, such as:  ? Trouble starting urination and emptying the bladder.  ? Frequent urinary tract infections.  ? Leaking urine when coughing, laughing, or exercising (stress incontinence).  ? Having to pass urine urgently or frequently.  ? Pain when passing urine.  · Bowel symptoms, such as:  ? Constipation.  ? Urgent or frequent bowel movements.  ? Incomplete bowel movements.  ? Painful bowel movements.  ? Leaking stool or gas.  · Unexplained genital or rectal pain.  · Genital or rectal muscle spasms.  · Low back pain.  In women, symptoms of PFD may also include:  · A heavy, full, or aching feeling in  the vagina.  · A bulge that protrudes into the vagina.  · Pain during or after sexual intercourse.  How is this diagnosed?  This condition may be diagnosed based on:  · Your symptoms and medical history.  · A physical exam. During the exam, your health care provider may check your pelvic muscles for tightness, spasm, pain, or weakness. This may include a rectal exam and a pelvic exam for women.  In some cases, you may have diagnostic tests, such as:  · Electrical muscle function tests.  · Urine flow testing.  · X-ray tests of bowel function.  · Ultrasound of the pelvic organs.  How is this treated?  Treatment for this condition depends on your symptoms. Treatment options include:  · Physical therapy. This may include Kegel exercises to help relax or strengthen the pelvic floor muscles.  · Biofeedback. This type of therapy provides feedback on how tight your pelvic floor muscles are so that you can learn to control them.  · Internal or external massage therapy.  · A treatment that involves electrical stimulation of the pelvic floor muscles to help control pain (transcutaneous electrical nerve stimulation, or TENS).  · Sound wave therapy (ultrasound) to reduce muscle spasms.  · Medicines, such as:  ? Muscle relaxants.  ? Bladder control medicines.  Surgery to reconstruct or support pelvic floor muscles may be an option if other treatments do not help.  Follow these instructions at home:  Activity  ·   Do your usual activities as told by your health care provider. Ask your health care provider if you should modify any activities.  · Do pelvic floor strengthening or relaxing exercises at home as told by your physical therapist.  Lifestyle  · Maintain a healthy weight.  · Eat foods that are high in fiber, such as beans, whole grains, and fresh fruits and vegetables.  · Limit foods that are high in fat and processed sugars, such as fried or sweet foods.  · Manage stress with relaxation techniques such as yoga or  meditation.  General instructions  · If you have problems with leakage:  ? Use absorbable pads or wear padded underwear.  ? Wash frequently with mild soap.  ? Keep your genital and anal area as clean and dry as possible.  ? Ask your health care provider if you should try a barrier cream to prevent skin irritation.  · Take warm baths to relieve pelvic muscle tension or spasms.  · Take over-the-counter and prescription medicines only as told by your health care provider.  · Keep all follow-up visits as told by your health care provider. This is important.  Contact a health care provider if you:  · Are not improving with home care.  · Have signs or symptoms of PFD that get worse at home.  · Develop new signs or symptoms at home.  · Have signs of a urinary tract infection, such as:  ? Fever.  ? Chills.  ? Urinary frequency.  ? A burning feeling when urinating.  · Have not had a bowel movement in 3 days (constipation).  Summary  · Pelvic floor dysfunction results when the muscles and connective tissues in your pelvic floor do not work well.  · These muscles and their connections form a sling that supports your colon and bladder. In men, these muscles also support the prostate gland. In women, they also support the uterus.  · PFD may be caused by an injury to the pelvic area or by a weakening of pelvic muscles.  · PFD causes pelvic floor muscles to be too weak, too tight, or a combination of both. Symptoms may vary from person to person.  · In most cases, PFD can be treated with physical therapies and medicines. Surgery may be an option if other treatments do not help.  This information is not intended to replace advice given to you by your health care provider. Make sure you discuss any questions you have with your health care provider.  Document Released: 07/13/2017 Document Revised: 07/13/2017 Document Reviewed: 07/13/2017  Elsevier Interactive Patient Education © 2019 [employer]

## 2018-02-23 NOTE — Progress Notes (Addendum)
Post Partum Exam  Kimberly [name] is a 23 y.o. G81P2002 female who presents for a postpartum visit. She is 4 week postpartum following a spontaneous vaginal delivery. I have fully reviewed the prenatal and intrapartum course. The delivery was at 39.1 gestational weeks.  Anesthesia: epidural. Postpartum course has been uneventful. Baby's course has been unremarkable . Baby is feeding by breast. Bleeding thin lochia. Bowel function is abnormal: constipation. Bladder function is normal. Patient is not sexually active. Contraception method is mini pill prescribed by [facility]. Postpartum depression screening:neg. Patient states that she feels that her urethra has prolapsed, and that she has increased urgency with urination. She denies pain, but endorses mild pelvic pressure. Patient  endorses constipation, but states that she had constipation prior to this pregnancy. Denies use of bulking agents or stool softeners.  Last pap smear done 08/10/2017 and was Normal  Review of Systems Pertinent items are noted in HPI.    Objective:  Blood pressure 111/84, pulse 82, weight 155 lb (70.3 kg), unknown if currently breastfeeding.  General:  alert, cooperative and no distress   Breasts:  inspection negative, no nipple discharge or bleeding, no masses or nodularity palpable  Lungs: clear to auscultation bilaterally  Heart:  regular rate and rhythm, S1, S2 normal, no murmur, click, rub or gallop  Abdomen: soft, non-tender; bowel sounds normal; no masses,  no organomegaly   Vulva:  normal  Vagina: normal vagina, no discharge, exudate, lesion, or erythema. Urethrocele, rectocele, and cystocele present. Cystocele grade 1 using POPQ classification system.   Cervix:  Not examined.  Corpus: not examined  Adnexa:  not evaluated  Rectal Exam: Not performed.        Assessment:e:10129} at today's visit.   Patient is here for her 4 week postpartum visit. She is doing well overall, but had concerns about her urethral  prolapse. On exam, patient had urethrocele, cystocele, and rectocele. Patient may benefit from physical therapy for pelvic floor exercises.  Plan:   1. Contraception: mini pill prescribed by [facility] 2. Referral to physical therapy for pelvic floor exercises. Appointment scheduled on 2/26 at 3:30 pm.  3. Follow up in one year for 1 year annual well women exam or as needed.     I confirm that I have verified the information documented in the physician assistant student's note and that I have also personally reperformed the history, physical exam and all medical decision making activities of this service and have verified that all service and findings are accurately documented in this student's note.   -Pubic symphysis pain has resolved; she did not have any PT for this during pregnancy.  -Stage 1 POP -Patient to have Pelvic floor PT appt next week -Pap smear is up to date -Encouraged bone broth for collagen replacement; discussed next steps (pessary vs. Surgery). Patient will make MD appt if she decides that pelvic floor PT isn't working   Marylene Land, [place] 02/23/2018 3:27 PM

## 2018-02-23 NOTE — Progress Notes (Deleted)
Subjective:     Kimberly [name] is a 23 y.o. female who presents for a postpartum visit. She is 4 weeks postpartum following a spontaneous vaginal delivery. I have fully reviewed the prenatal and intrapartum course. The delivery was at 39.1 gestational weeks. Outcome: spontaneous vaginal delivery. Anesthesia: epidural. Postpartum course has been unremarkable. Baby's course has been unremarkable. Baby is feeding by breast. Bleeding staining only. Bowel function is normal. Bladder function is normal. Patient is not sexually active. Contraception method is none. Postpartum depression screening: negative.  {Common ambulatory SmartLinks:19316}  Review of Systems {ros; complete:30496}   Objective:    There were no vitals taken for this visit.  General:  {gen appearance:16600}   Breasts:  {breast exam:1202::"inspection negative, no nipple discharge or bleeding, no masses or nodularity palpable"}  Lungs: {lung exam:16931}  Heart:  {heart exam:5510}  Abdomen: {abdomen exam:16834}   Vulva:  {labia exam:12198}  Vagina: {vagina exam:12200}  Cervix:  {cervix exam:14595}  Corpus: {uterus exam:12215}  Adnexa:  {adnexa exam:12223}  Rectal Exam: {rectal/vaginal exam:12274}        Assessment:    *** postpartum exam. Pap smear {done:10129} at today's visit.   Plan:    1. Contraception: {method:5051} 2. *** 3. Follow up in: {1-10:13787} {time; units:19136} or as needed.

## 2018-03-03 ENCOUNTER — Ambulatory Visit: Payer: Medicaid Other | Attending: Obstetrics and Gynecology | Admitting: Physical Therapy

## 2018-03-26 ENCOUNTER — Encounter: Payer: Self-pay | Admitting: *Deleted

## 2019-02-01 ENCOUNTER — Encounter: Payer: Self-pay | Admitting: Medical

## 2019-02-01 ENCOUNTER — Ambulatory Visit (INDEPENDENT_AMBULATORY_CARE_PROVIDER_SITE_OTHER): Payer: Medicaid Other | Admitting: Medical

## 2019-02-01 ENCOUNTER — Other Ambulatory Visit: Payer: Self-pay

## 2019-02-01 VITALS — BP 107/72 | HR 75 | Ht 65.0 in | Wt 145.0 lb

## 2019-02-01 DIAGNOSIS — Z3009 Encounter for other general counseling and advice on contraception: Secondary | ICD-10-CM

## 2019-02-01 MED ORDER — NORETHINDRONE 0.35 MG PO TABS: 1.0000 | ORAL_TABLET | Freq: Every day | ORAL | 11 refills | Status: DC

## 2019-02-01 NOTE — Patient Instructions (Signed)

## 2019-02-01 NOTE — Progress Notes (Signed)
History:  Kimberly [name] is a 24 y.o. G2P2002 who presents to clinic today for annual exam. She is on Micronor and would like to continue this for birth control. She needs a refill. She is still breastfeeding. Her child is just over 41 year old. Last pap was normal 08/2017.    The following portions of the patient's history were reviewed and updated as appropriate: allergies, current medications, family history, past medical history, social history, past surgical history and problem list.  Review of Systems:  Review of Systems  Constitutional: Negative for fever and malaise/fatigue.  Gastrointestinal: Negative for abdominal pain, constipation, diarrhea, nausea and vomiting.  Genitourinary: Negative for dysuria, frequency and urgency.       Neg - vaginal bleeding      Objective:  Physical Exam BP 107/72   Pulse 75   Ht 5\' 5"  (1.651 m)   Wt 145 lb (65.8 kg)   BMI 24.13 kg/m  Physical Exam  Nursing note and vitals reviewed. Constitutional: She is oriented to person, place, and time. She appears well-developed and well-nourished. No distress.  HENT:  Head: Normocephalic and atraumatic.  Eyes: EOM are normal.  Neck: No thyromegaly present.  Cardiovascular: Normal rate, regular rhythm and normal heart sounds.  No murmur heard. Respiratory: Effort normal and breath sounds normal. No respiratory distress. She has no wheezes.  GI: Soft. Bowel sounds are normal. She exhibits no distension and no mass. There is no abdominal tenderness. There is no rebound and no guarding.  Musculoskeletal:     Cervical back: Normal range of motion and neck supple.  Neurological: She is alert and oriented to person, place, and time.  Skin: Skin is warm and dry. No erythema.  Psychiatric: She has a normal mood and affect.    Assessment & Plan:  1. Unwanted fertility - norethindrone (MICRONOR) 0.35 MG tablet; Take 1 tablet (0.35 mg total) by mouth daily.  Dispense: 1 Package; Refill: 11 - patient  advised to let know if she discontinues breast feeding priori to 1 year to change to combination pills  - patient to return to [facility] in 1 year for annual exam or sooner PRN  [place] 02/01/2019 8:38 AM

## 2019-12-07 IMAGING — US US OB COMP LESS 14 WK
1 series · 15 of 28 positions shown · non-contrast
Comparison: None.

CLINICAL DATA: 21-year-old pregnant female presenting with
abdominal pain. LMP: 04/27/2017 corresponding to an estimated
gestational age of 9 weeks, 3 days

EXAM:
OBSTETRIC <14 WK ULTRASOUND
TECHNIQUE: Transabdominal ultrasound was performed for evaluation of the
gestation as well as the maternal uterus and adnexal regions.

[Series 1: us ob comp less 14 wk · 48 acquisitions, 15 frames shown]
[im 1/48]
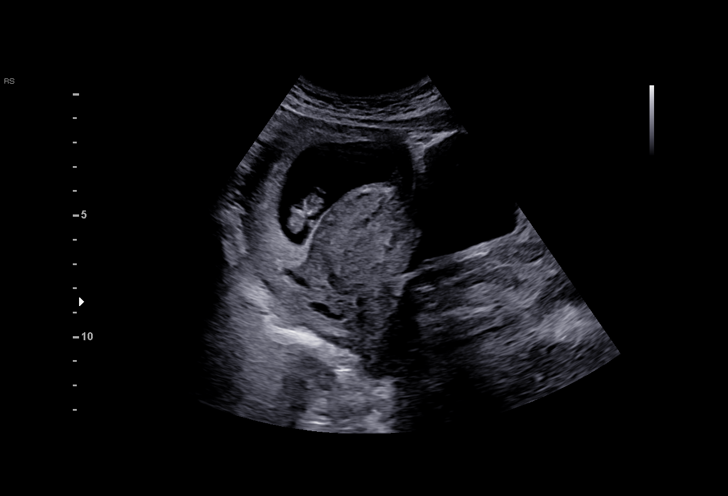
[im 4/48]
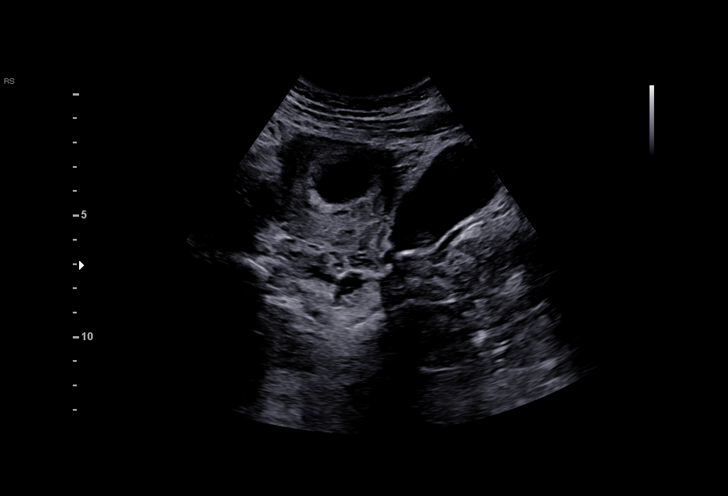
[im 7/48]
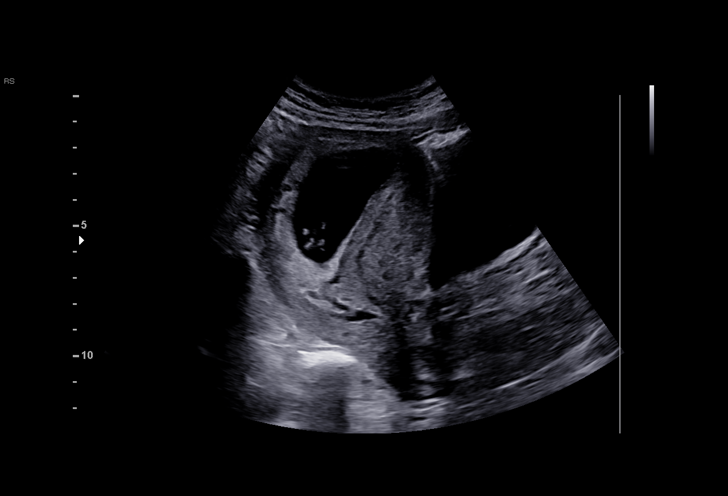
[im 11/48]
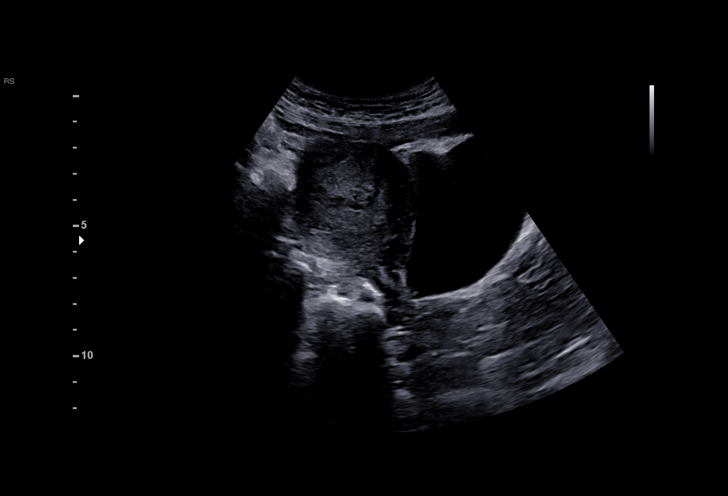
[im 14/48]
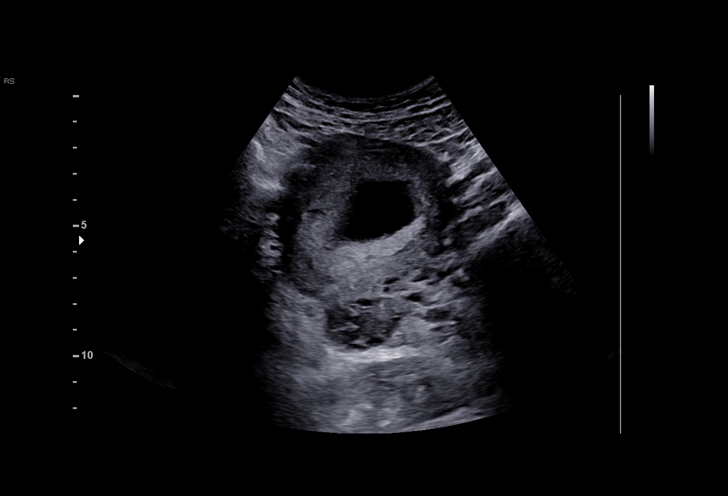
[im 18/48]
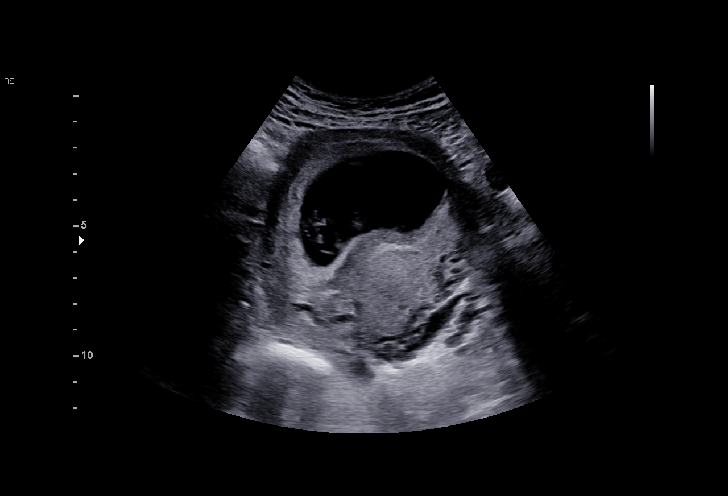
[im 21/48]
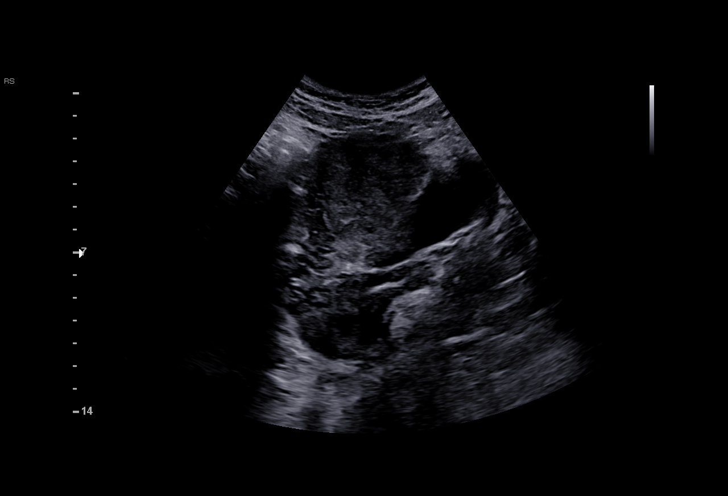
[im 25/48]
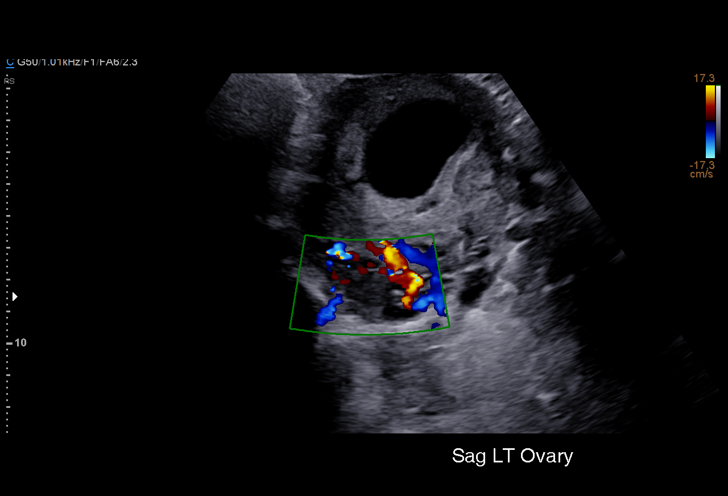
[im 27/48]
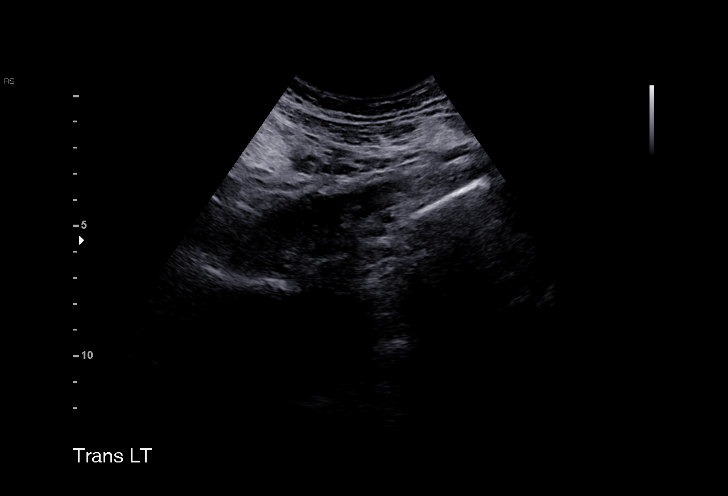
[im 30/48]
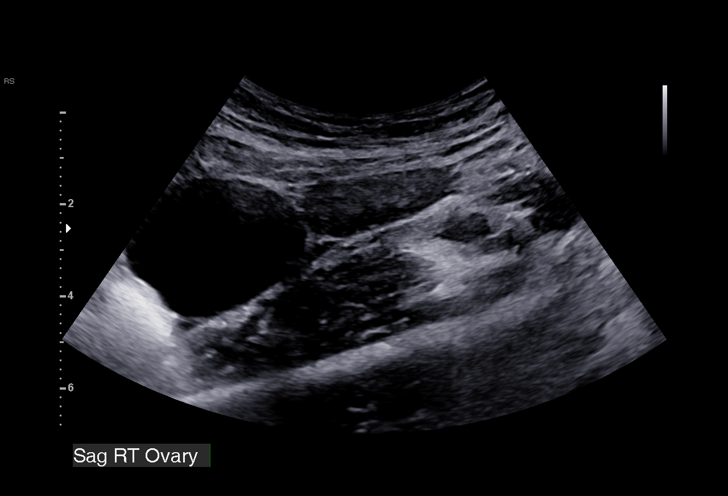
[im 34/48]
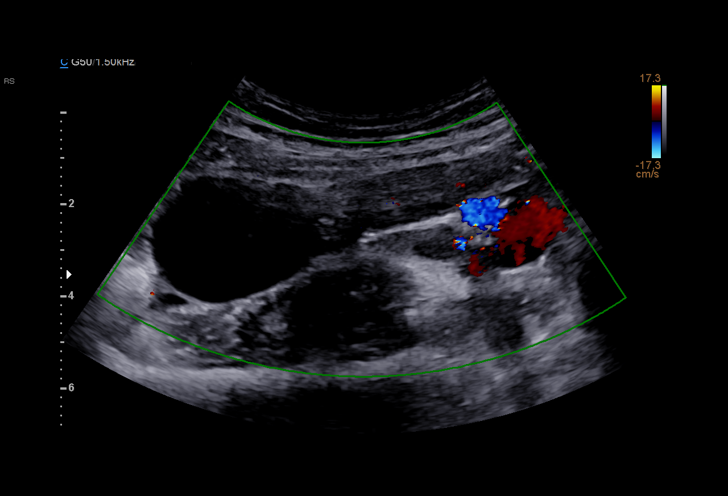
[im 37/48]
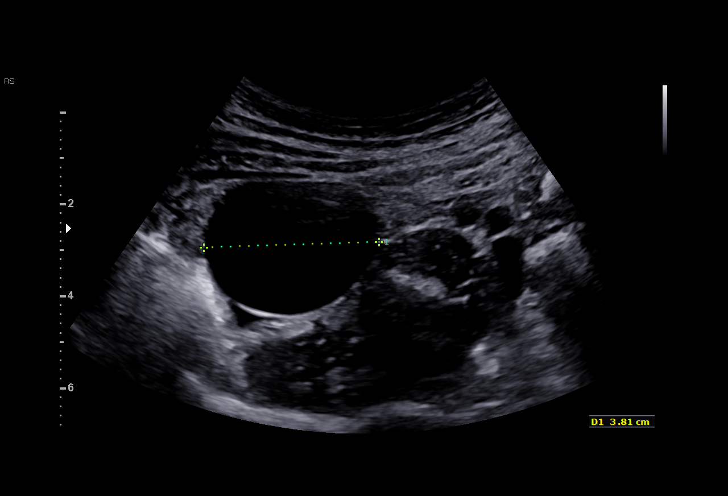
[im 41/48]
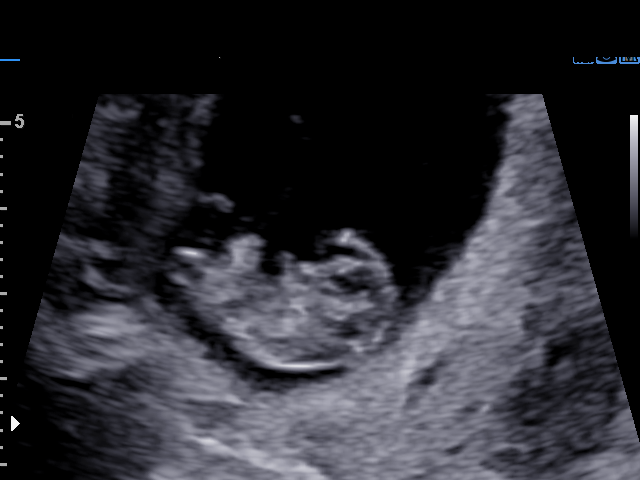
[im 44/48]
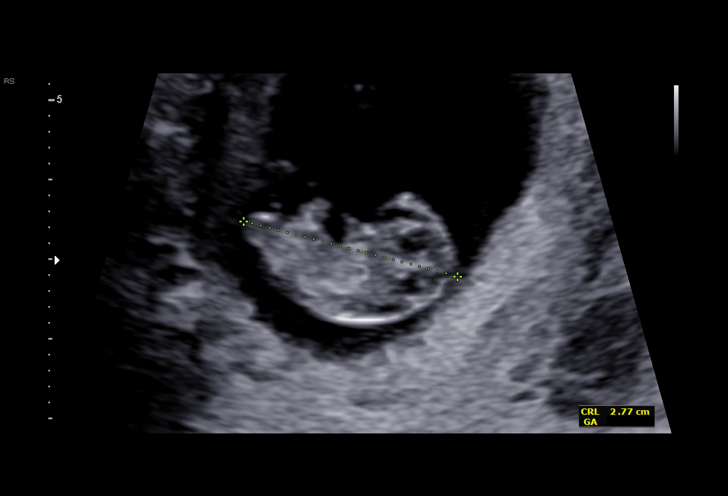
[im 48/48]
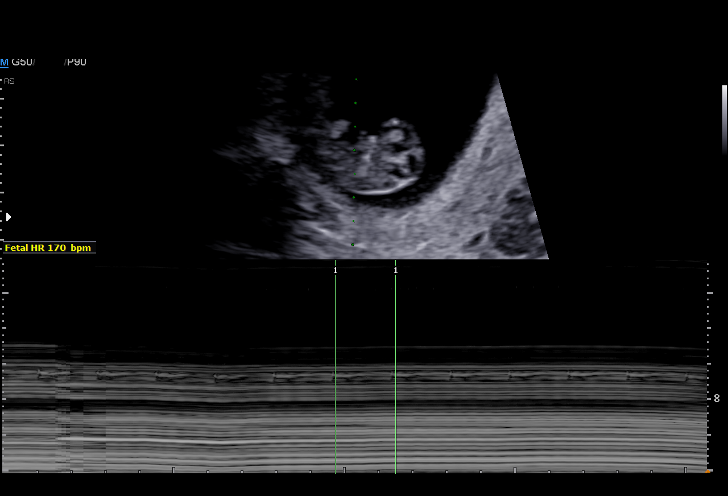

[15 of 28 positions shown; findings below may reference images not displayed]

FINDINGS: Intrauterine gestational sac: Single intrauterine gestational sac.

Yolk sac:  Seen

Embryo:  Present

Cardiac Activity: Detected

Heart Rate: 170 bpm

CRL:   27 mm   9 w 4 d                  US EDC: 01/31/2018

Subchorionic hemorrhage: Small subchorionic hemorrhage measuring
x 3.6 x 1.2 cm.

Maternal uterus/adnexae: The maternal ovaries are unremarkable.
There is a 4.9 x 3.0 x 3.8 cm corpus luteum cyst in the right ovary.
IMPRESSION: 1. Single live intrauterine pregnancy with an estimated gestational
age of 9 weeks, 4 days based on today's ultrasound, concordant with
age based on LMP.
2. Small subchorionic hemorrhage.

## 2020-02-13 ENCOUNTER — Ambulatory Visit (INDEPENDENT_AMBULATORY_CARE_PROVIDER_SITE_OTHER): Payer: Self-pay

## 2020-02-13 ENCOUNTER — Encounter (HOSPITAL_COMMUNITY): Payer: Self-pay

## 2020-02-13 ENCOUNTER — Ambulatory Visit (HOSPITAL_COMMUNITY)
Admission: EM | Admit: 2020-02-13 | Discharge: 2020-02-13 | Disposition: A | Discharge status: Home / Self Care | Payer: Self-pay | Attending: Emergency Medicine | Admitting: Emergency Medicine

## 2020-02-13 DIAGNOSIS — R059 Cough, unspecified: Secondary | ICD-10-CM

## 2020-02-13 DIAGNOSIS — R079 Chest pain, unspecified: Secondary | ICD-10-CM

## 2020-02-13 DIAGNOSIS — R0789 Other chest pain: Secondary | ICD-10-CM

## 2020-02-13 MED ORDER — PREDNISONE 20 MG PO TABS: 40.0000 mg | ORAL_TABLET | Freq: Every day | ORAL | 0 refills | Status: AC

## 2020-02-13 MED ORDER — BENZONATATE 100 MG PO CAPS: 100.0000 mg | ORAL_CAPSULE | Freq: Three times a day (TID) | ORAL | 0 refills | Status: DC | PRN

## 2020-02-13 NOTE — Discharge Instructions (Signed)
Your chest xray looks well today- your lungs are clear and there is no evidence of rib fracture.  Tessalon as needed for cough. Mucinex d can also be helpful.  Complete 5 days of prednisone to see if this is helpful with pain and cough also.  Tylenol or ibuprofen as needed.  If symptoms worsen or do not improve in the next week to return to be seen or to follow up with your PCP.

## 2020-02-13 NOTE — ED Provider Notes (Signed)
[facility]    CSN: [id number] Arrival date & time: 02/13/20  1054      History   Chief Complaint Chief Complaint  Patient presents with  . right rib pain  . Cough    HPI Kimberly [name] is a 25 y.o. female.   Carrine Kroboth presents with complaints of right lateral chest and chest wall pain. She was diagnosed with covid-19 ~4 weeks ago and has had a cough ever since. Cough has not improved. Now it is painful when she coughs. Also painful to touch as well. No shortness of breath but feels that she is breathing more shallow related to pain with deep inspiration. Cough is now productive. No fevers. No history of asthma. Took tylenol last night which didn't necessarily help.     ROS per HPI, negative if not otherwise mentioned.      Past Medical History:  Diagnosis Date  . Bipolar 1 disorder (HCC)   . Constipation   . Enteritis   . No pertinent past medical history   . Ovarian cyst     Patient Active Problem List   Diagnosis Date Noted  . 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 Vaginal delivery 01/27/2018  . Bipolar 1 disorder (HCC) 07/05/2014  . Chronic constipation 11/11/2011  . Ovarian cyst     Past Surgical History:  Procedure Laterality Date  . TONSILLECTOMY      OB History    Gravida  2   Para  2   Term  2   Preterm  0   AB  0   Living  2     SAB  0   IAB  0   Ectopic  0   Multiple  0   Live Births  2            Home Medications    Prior to Admission medications   Medication Sig Start Date End Date Taking? Authorizing Provider  benzonatate (TESSALON) 100 MG capsule Take 1-2 capsules (100-200 mg total) by mouth 3 (three) times daily as needed for cough. 02/13/20  Yes Johncharles Fusselman, Dorene Grebe B, NP  predniSONE (DELTASONE) 20 MG tablet Take 2 tablets (40 mg total) by mouth daily with breakfast for 5 days. 02/13/20 02/18/20 Yes Jaedon Siler, Barron Alvine, NP  Ferrous Fumarate (HEMOCYTE - 106 MG FE) 324 (106 Fe) MG TABS tablet Take 1 tablet  by mouth.    [provider]  ibuprofen (ADVIL,MOTRIN) 600 MG tablet Take 1 tablet (600 mg total) by mouth every 6 (six) hours. Patient not taking: Reported on 02/01/2019 01/27/18   Levie Heritage, DO  norethindrone (MICRONOR) 0.35 MG tablet Take 1 tablet (0.35 mg total) by mouth daily. 02/01/19   Marny Lowenstein, PA-C  Prenatal Vit-Fe Fumarate-FA (PRENATAL MULTIVITAMIN) TABS tablet Take 1 tablet by mouth daily at 12 noon.    [provider]    Family History Family History  Problem Relation Age of Onset  . Asthma Brother   . Hypertension Mother   . Diabetes Paternal Uncle   . Diabetes Maternal Grandmother   . Cancer Maternal Grandmother   . Diabetes Maternal Grandfather   . Cancer Maternal Grandfather   . Stroke Maternal Grandfather   . Hirschsprung's disease Neg Hx     Social History Social History   Tobacco Use  . Smoking status: Never Smoker  . Smokeless tobacco: Never Used  Vaping Use  . Vaping Use: Never used  Substance Use Topics  . Alcohol use:  No  . Drug use: No     Allergies   Patient has no known allergies.   Review of Systems Review of Systems   Physical Exam Triage Vital Signs ED Triage Vitals  Enc Vitals Group     BP 02/13/20 1250 121/87     Pulse Rate 02/13/20 1250 81     Resp 02/13/20 1250 18     Temp 02/13/20 1250 98.4 F (36.9 C)     Temp Source 02/13/20 1250 Oral     SpO2 02/13/20 1250 98 %     Weight --      Height --      Head Circumference --      Peak Flow --      Pain Score 02/13/20 1245 8     Pain Loc --      Pain Edu? --      Excl. in GC? --    No data found.  Updated Vital Signs BP 121/87 (BP Location: Right Arm)   Pulse 81   Temp 98.4 F (36.9 C) (Oral)   Resp 18   LMP 01/14/2020   SpO2 98%   Visual Acuity Right Eye Distance:   Left Eye Distance:   Bilateral Distance:    Right Eye Near:   Left Eye Near:    Bilateral Near:     Physical Exam Constitutional:      General: She is not in acute  distress.    Appearance: She is well-developed.  Cardiovascular:     Rate and Rhythm: Normal rate.  Pulmonary:     Effort: Pulmonary effort is normal.  Chest:     Chest wall: Tenderness present.       Comments: Point tenderness to  Skin:    General: Skin is warm and dry.  Neurological:     Mental Status: She is alert and oriented to person, place, and time.      UC Treatments / Results  Labs (all labs ordered are listed, but only abnormal results are displayed) Labs Reviewed - No data to display  EKG   Radiology DG Ribs Unilateral W/Chest Right  Result Date: 02/13/2020 CLINICAL DATA:  Chest pain EXAM: RIGHT RIBS AND CHEST - 3+ VIEW COMPARISON:  None. FINDINGS: Frontal chest as well as oblique and cone-down rib images obtained. Lungs are clear. Heart size and pulmonary vascularity are normal. No adenopathy. There is no appreciable pleural effusion or pneumothorax. No evident rib fracture. IMPRESSION: No evident rib fracture.  Lungs clear. Electronically Signed   By: Bretta Bang III M.D.   On: 02/13/2020 13:54    Procedures Procedures (including critical care time)  Medications Ordered in UC Medications - No data to display  Initial Impression / Assessment and Plan / UC Course  I have reviewed the triage vital signs and the nursing notes.  Pertinent labs & imaging results that were available during my care of the patient were reviewed by me and considered in my medical decision making (see chart for details).     Chest/ rib series without acute findings today. Continued cough. No shortness of breath . No fevers. Chest wall soreness related to frequent cough likely, with supportive cares provided, including prednisone course. Return precautions provided. Post-covid care clinic information given as well. Patient verbalized understanding and agreeable to plan.   Final Clinical Impressions(s) / UC Diagnoses   Final diagnoses:  Cough  Chest wall pain      Discharge Instructions     Your chest  xray looks well today- your lungs are clear and there is no evidence of rib fracture.  Tessalon as needed for cough. Mucinex d can also be helpful.  Complete 5 days of prednisone to see if this is helpful with pain and cough also.  Tylenol or ibuprofen as needed.  If symptoms worsen or do not improve in the next week to return to be seen or to follow up with your PCP.      ED Prescriptions    Medication Sig Dispense Auth. Provider   benzonatate (TESSALON) 100 MG capsule Take 1-2 capsules (100-200 mg total) by mouth 3 (three) times daily as needed for cough. 21 capsule Linus Mako B, NP   predniSONE (DELTASONE) 20 MG tablet Take 2 tablets (40 mg total) by mouth daily with breakfast for 5 days. 10 tablet Georgetta Haber, NP     PDMP not reviewed this encounter.   Georgetta Haber, NP 02/13/20 1406

## 2020-02-13 NOTE — ED Triage Notes (Signed)
Pt c/o right rib pain for approx 2 days that increases with inspiration, cough, movement. Pt states she has been coughing for approx 3 weeks 2/2 COVID. Describes rib pain sharp in nature. Took 650mg  tylenol last night. No cough suppressants taken.   Denies fever, SOB, n/v/d, dizziness.  Bilateral lung sounds CTA.

## 2020-02-14 ENCOUNTER — Telehealth: Payer: Self-pay

## 2020-02-14 NOTE — Telephone Encounter (Signed)
[facility] f/u. Pt states still having chest soreness / pain. Pt given Prednisone and tessalon yesterday. Pt states will give a few days to see if chest wall pain improves & if not pt will call for appt at [facility].

## 2021-09-16 ENCOUNTER — Encounter: Payer: Self-pay | Admitting: Nurse Practitioner

## 2021-09-16 ENCOUNTER — Ambulatory Visit (INDEPENDENT_AMBULATORY_CARE_PROVIDER_SITE_OTHER): Payer: Commercial Managed Care - PPO | Admitting: Nurse Practitioner

## 2021-09-16 VITALS — BP 112/71 | HR 77 | Ht 65.0 in | Wt 152.0 lb

## 2021-09-16 DIAGNOSIS — E559 Vitamin D deficiency, unspecified: Secondary | ICD-10-CM

## 2021-09-16 DIAGNOSIS — Z0000 Encounter for general adult medical examination without abnormal findings: Secondary | ICD-10-CM

## 2021-09-16 DIAGNOSIS — M25531 Pain in right wrist: Secondary | ICD-10-CM | POA: Diagnosis not present

## 2021-09-16 DIAGNOSIS — E049 Nontoxic goiter, unspecified: Secondary | ICD-10-CM

## 2021-09-16 DIAGNOSIS — Z7689 Persons encountering health services in other specified circumstances: Secondary | ICD-10-CM

## 2021-09-16 DIAGNOSIS — R1319 Other dysphagia: Secondary | ICD-10-CM

## 2021-09-16 DIAGNOSIS — E063 Autoimmune thyroiditis: Secondary | ICD-10-CM

## 2021-09-16 DIAGNOSIS — M25532 Pain in left wrist: Secondary | ICD-10-CM

## 2021-09-16 HISTORY — DX: Autoimmune thyroiditis: E06.3

## 2021-09-16 NOTE — Progress Notes (Signed)
New Patient Office Visit  Subjective    Patient ID: Kimberly [name], female    DOB: 04/15/1995  Age: 26 y.o. MRN: [id number]  CC:  Chief Complaint  Patient presents with   New Patient (Initial Visit)    HPI Kimberly [name] presents to establish care Patient does see GYN provider.  -having bilateral wrist pain. Pain travels from her wrist up her forearm. They hurt constantly, but especially while she is writing or using the computer. She denies injury to the wrist.s  -has not anything for the pain/tenderness.  -joints in her wrists pop constantly  -has been happening for years. Now effecting the middle, 4th, and 5th fingers.   Having trouble swallowing. Feels like she has to gulp. Food or pills get hung up in her throat and it is difficult to get them down. Ears pop when she swallows.  -did have tonsils and adenoids removed when she was younger.    Outpatient Encounter Medications as of 09/16/2021  Medication Sig   Prenatal Vit-Fe Fumarate-FA (PRENATAL MULTIVITAMIN) TABS tablet Take 1 tablet by mouth daily at 12 noon.   [DISCONTINUED] benzonatate (TESSALON) 100 MG capsule Take 1-2 capsules (100-200 mg total) by mouth 3 (three) times daily as needed for cough.   [DISCONTINUED] Ferrous Fumarate (HEMOCYTE - 106 MG FE) 324 (106 Fe) MG TABS tablet Take 1 tablet by mouth.   [DISCONTINUED] ibuprofen (ADVIL,MOTRIN) 600 MG tablet Take 1 tablet (600 mg total) by mouth every 6 (six) hours. (Patient not taking: Reported on 02/01/2019)   [DISCONTINUED] norethindrone (MICRONOR) 0.35 MG tablet Take 1 tablet (0.35 mg total) by mouth daily.   No facility-administered encounter medications on file as of 09/16/2021.    Past Medical History:  Diagnosis Date   Bipolar 1 disorder (HCC)    Constipation    Enteritis    No pertinent past medical history    Ovarian cyst     Past Surgical History:  Procedure Laterality Date   TONSILLECTOMY      Family History  Problem Relation Age of Onset    Asthma Brother    Hypertension Mother    Diabetes Paternal Uncle    Diabetes Maternal Grandmother    Cancer Maternal Grandmother    Diabetes Maternal Grandfather    Cancer Maternal Grandfather    Stroke Maternal Grandfather    Hirschsprung's disease Neg Hx     Social History   Socioeconomic History   Marital status: Married    Spouse name: Not on file   Number of children: Not on file   Years of education: Not on file   Highest education level: Not on file  Occupational History   Not on file  Tobacco Use   Smoking status: Never   Smokeless tobacco: Never  Vaping Use   Vaping Use: Never used  Substance and Sexual Activity   Alcohol use: No   Drug use: No   Sexual activity: Not Currently    Birth control/protection: None  Other Topics Concern   Not on file  Social History Narrative   10th grade   Social Determinants of Health   Financial Resource Strain: Not on file  Food Insecurity: No Food Insecurity (01/14/2018)   Hunger Vital Sign    Worried About Running Out of Food in the Last Year: Never true    Ran Out of Food in the Last Year: Never true  Transportation Needs: No Transportation Needs (01/14/2018)   PRAPARE - Administrator, Civil Service (Medical): No  Lack of Transportation (Non-Medical): No  Physical Activity: Not on file  Stress: Not on file  Social Connections: Not on file  Intimate Partner Violence: Not on file    Review of Systems  Constitutional:  Positive for malaise/fatigue. Negative for chills and fever.  HENT:  Negative for congestion, sinus pain and sore throat.        Difficulty swallowing. Feels like things are getting stuck or lodged in her throat.   Eyes: Negative.   Respiratory:  Negative for cough, shortness of breath and wheezing.   Cardiovascular:  Negative for chest pain, palpitations and leg swelling.  Gastrointestinal:  Negative for constipation, diarrhea, nausea and vomiting.  Genitourinary: Negative.    Musculoskeletal:  Negative for myalgias.  Skin: Negative.   Neurological:  Negative for dizziness and headaches.  Endo/Heme/Allergies:  Does not bruise/bleed easily.  Psychiatric/Behavioral:  Negative for depression. The patient is not nervous/anxious.         Objective    Today's Vitals   09/16/21 1003  BP: 112/71  Pulse: 77  SpO2: 99%  Weight: 152 lb (68.9 kg)  Height: 5\' 5"  (1.651 m)   Body mass index is 25.29 kg/m.   Physical Exam Vitals and nursing note reviewed.  Constitutional:      Appearance: Normal appearance. She is well-developed.  HENT:     Head: Normocephalic and atraumatic.     Mouth/Throat:     Mouth: Mucous membranes are moist.     Pharynx: Oropharynx is clear.  Eyes:     Extraocular Movements: Extraocular movements intact.     Conjunctiva/sclera: Conjunctivae normal.     Pupils: Pupils are equal, round, and reactive to light.  Neck:     Thyroid: Thyromegaly present. No thyroid mass or thyroid tenderness.  Cardiovascular:     Rate and Rhythm: Normal rate and regular rhythm.     Pulses: Normal pulses.     Heart sounds: Normal heart sounds.  Pulmonary:     Effort: Pulmonary effort is normal.     Breath sounds: Normal breath sounds.  Abdominal:     Palpations: Abdomen is soft.  Musculoskeletal:        General: Normal range of motion.     Cervical back: Normal range of motion and neck supple.  Lymphadenopathy:     Cervical: No cervical adenopathy.  Skin:    General: Skin is warm and dry.     Capillary Refill: Capillary refill takes less than 2 seconds.  Neurological:     General: No focal deficit present.     Mental Status: She is alert and oriented to person, place, and time.  Psychiatric:        Mood and Affect: Mood normal.        Behavior: Behavior normal.        Thought Content: Thought content normal.        Judgment: Judgment normal.      Assessment & Plan:  1. Bilateral wrist pain Concern for bilateral carpal tunnel syndrome.   Refer to orthopedics for further evaluation. - Ambulatory referral to Orthopedic Surgery  2. Enlarged thyroid Check thyroid panel along with thyroid antibodies.  We will get thyroid ultrasound for further evaluation.  Refer as indicated. - TSH + free T4 - THYROID; Future - Thyroid peroxidase antibody - Thyroglobulin Level  3. Other dysphagia Enlarged thyroid noted upon physical exam.  We will get ultrasound of thyroid for further evaluation and refer as indicated. - US THYROID; Future  4.  Vitamin D deficiency Check vitamin D level and treat deficiency as indicated. - VITAMIN D 25 Hydroxy (Vit-D Deficiency, Fractures)  5. Healthcare maintenance Routine, fasting labs drawn during today's visit.  - Hemoglobin A1c - Lipid panel - Comprehensive metabolic panel - CBC  6. Encounter to establish care Appointment today to establish new primary care provider      Problem List Items Addressed This Visit       Digestive   Other dysphagia   Relevant Orders   US THYROID (Completed)     Endocrine   Enlarged thyroid   Relevant Orders   TSH + free T4 (Completed)   US THYROID (Completed)   Thyroid peroxidase antibody (Completed)   Thyroglobulin Level (Completed)     Other   Bilateral wrist pain - Primary   Relevant Orders   Ambulatory referral to Orthopedic Surgery   Vitamin D deficiency   Relevant Orders   VITAMIN D 25 Hydroxy (Vit-D Deficiency, Fractures) (Completed)   Other Visit Diagnoses     Healthcare maintenance       Relevant Orders   Hemoglobin A1c (Completed)   Lipid panel (Completed)   Comprehensive metabolic panel (Completed)   CBC (Completed)   Encounter to establish care           Return in about 3 weeks (around 10/07/2021) for review ultrasound and lab results .   Carlean Jews, NP

## 2021-09-17 ENCOUNTER — Encounter: Payer: Self-pay | Admitting: Nurse Practitioner

## 2021-09-17 ENCOUNTER — Ambulatory Visit
Admission: RE | Admit: 2021-09-17 | Discharge: 2021-09-17 | Disposition: A | Discharge status: Home / Self Care | Payer: Commercial Managed Care - PPO | Source: Ambulatory Visit | Attending: Nurse Practitioner | Admitting: Nurse Practitioner

## 2021-09-17 ENCOUNTER — Other Ambulatory Visit: Payer: Self-pay | Admitting: Nurse Practitioner

## 2021-09-17 DIAGNOSIS — E559 Vitamin D deficiency, unspecified: Secondary | ICD-10-CM

## 2021-09-17 DIAGNOSIS — E049 Nontoxic goiter, unspecified: Secondary | ICD-10-CM

## 2021-09-17 DIAGNOSIS — E063 Autoimmune thyroiditis: Secondary | ICD-10-CM

## 2021-09-17 DIAGNOSIS — R1319 Other dysphagia: Secondary | ICD-10-CM

## 2021-09-17 MED ORDER — ERGOCALCIFEROL 1.25 MG (50000 UT) PO CAPS: 50000.0000 [IU] | ORAL_CAPSULE | ORAL | 5 refills | Status: DC

## 2021-09-17 MED ORDER — LEVOTHYROXINE SODIUM 25 MCG PO TABS: 25.0000 ug | ORAL_TABLET | Freq: Every day | ORAL | 2 refills | Status: DC

## 2021-09-17 NOTE — Progress Notes (Signed)
Thyroid ultrasound negative for discreet nodule but mildly enlarged and consistent with thyroid disease.

## 2021-09-18 ENCOUNTER — Encounter: Payer: Self-pay | Admitting: Sports Medicine

## 2021-09-18 ENCOUNTER — Ambulatory Visit (INDEPENDENT_AMBULATORY_CARE_PROVIDER_SITE_OTHER): Payer: Commercial Managed Care - PPO | Admitting: Sports Medicine

## 2021-09-18 VITALS — BP 110/75 | Ht 65.0 in | Wt 150.0 lb

## 2021-09-18 DIAGNOSIS — G5690 Unspecified mononeuropathy of unspecified upper limb: Secondary | ICD-10-CM

## 2021-09-18 DIAGNOSIS — E063 Autoimmune thyroiditis: Secondary | ICD-10-CM

## 2021-09-18 MED ORDER — VITAMIN B-6 100 MG PO TABS: 100.0000 mg | ORAL_TABLET | Freq: Every day | ORAL | 0 refills | Status: AC

## 2021-09-18 MED ORDER — GABAPENTIN 100 MG PO CAPS: 100.0000 mg | ORAL_CAPSULE | Freq: Two times a day (BID) | ORAL | 1 refills | Status: DC

## 2021-09-18 NOTE — Patient Instructions (Signed)
Kimberly [name], it was great to see you today, thank you for letting me participate in your care!  Today, we discussed your nerve pain.  - Vitamin B6 100mg  once a day (or can take Complex B vitamins) -Recommend obtaining carpal tunnel cock-up wrist braces to be worn at nighttime, you may find them on [employer] -Begin gabapentin 100 mg --> start with 1 tablet nightly, if tolerating well for the next 3-4 days, increase to taking 100 mg 1 tablet twice a day.  After 2 weeks if tolerating this well, may increase to 2 tablets (200mg ) twice a day  You will follow-up in 6 weeks.  If you have any further questions, please give the clinic a call [phone].  , DO Primary Care Sports Medicine Physician  [facility] - Orthopedics

## 2021-09-18 NOTE — Progress Notes (Signed)
Bilateral wrist pain; no injury. Pain/ache into her fingers. Pain does radiate up to her elbows. Denies use of medication for pain.

## 2021-09-18 NOTE — Progress Notes (Signed)
Kimberly [name] - 26 y.o. female MRN [id number]  Date of birth: 03/13/1995  Office Visit Note: Visit Date: 09/18/2021 PCP: Carlean Jews, NP Referred by: Carlean Jews, NP  Subjective: Chief Complaint  Patient presents with   Right Wrist - Pain   Left Wrist - Pain   HPI: Kimberly [name] is a pleasant 26 y.o. female who presents today for bilateral wrist tingling and pain.  Reports as a aching pain, but is more so electric-like and tingling in nature.  This has been present for a few years now, although worse recently.  The pain will radiate into the fingers as well as up to the elbows at times.  Pain always initiates in the wrist.  Denies any specific injury.  She does have a newly diagnosed Hashimoto's thyroiditis, just began Synthroid treatment today 09/18/2021.  She denies any family history of thyroid or autoimmune or Raynaud's phenomenon.  She does feel like the fingers are more pale at times than normal.  He is not taking any medication for this.  Pertinent ROS were reviewed with the patient and found to be negative unless otherwise specified above in HPI.   Assessment & Plan: Visit Diagnoses:  1. Neuropathy of upper extremity, unspecified laterality   2. 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  I do think this is largely due in part to her hypothyroidism as this is overt with notable hypothyroid labs.  It is reassuring that she just began treatment today for thyroid replacement therapy.  Given the bothersome nature of her symptoms, we will start her on a low-dose of gabapentin, start once daily but will uptitrate to 200 mg twice daily.  Reviewed the risk/benefits/indications with her as she is weaning off of breast-feeding her 52-year-old.  Also prescribed vitamin B6 100 mg once daily for nerve health.  I did discuss with her potential aid with bilateral cock-up wrist braces at nighttime, she  may order these over-the-counter if desires.  We will see her back in 6 weeks for reevaluation.  I would expect this to get better as her thyroid becomes euthyroid and with medication therapy.  If she is having no improvement, we may consider EMG/nerve conduction study.  Follow-up: 6 weeks with Dr. Shon Baton   Meds & Orders:  Meds ordered this encounter  Medications   gabapentin (NEURONTIN) 100 MG capsule    Sig: Take 1 capsule (100 mg total) by mouth 2 (two) times daily.    Dispense:  60 capsule    Refill:  1   pyridOXINE (VITAMIN B6) 100 MG tablet    Sig: Take 1 tablet (100 mg total) by mouth daily.    Dispense:  60 tablet    Refill:  0   No orders of the defined types were placed in this encounter.    Procedures: No procedures performed      Clinical History: No specialty comments available.  She reports that she has never smoked. She has never used smokeless tobacco.  Recent Labs    09/16/21 1034  HGBA1C 5.5    Objective:   Vital Signs: BP 110/75   Ht 5\' 5"  (1.651 m)   Wt 150 lb (68 kg)   LMP 08/21/2021   BMI 24.96 kg/m   Physical Exam  Gen: Well-appearing, in no acute distress; non-toxic CV: Regular Rate. Well-perfused. Warm.  Resp: Breathing unlabored on room air; no wheezing. Psych: Fluid speech in  conversation; appropriate affect; normal thought process Neuro: Sensation intact throughout. No gross coordination deficits.   Ortho Exam -Bilateral wrist: Normal alignment of the wrist, no bony deformity noted.  No specific bony TTP.  Full range of motion in flexion and extension. + Tinel sign over the carpal tunnel. + Reproduction of symptoms with Phalen's and reverse Phalen's.  Only mild symptoms with Tinel's over the cubital tunnel.  5/5 strength of the hands and fingers.  Cap refill less than 2 seconds.  There is some pale skin turgor after provocative testing of the hands and fingers.  Imaging: US THYROID  Result Date: 09/17/2021 CLINICAL DATA:  Thyroid  enlargement, dysphagia EXAM: THYROID ULTRASOUND TECHNIQUE: Ultrasound examination of the thyroid gland and adjacent soft tissues was performed. COMPARISON:  None Available. FINDINGS: Parenchymal Echotexture: Markedly heterogenous Isthmus: 6 mm Right lobe: 5.8 x 2.0 x 2.3 cm Left lobe: 5.1 x 1.7 x 2.2 cm _________________________________________________________ Estimated total number of nodules >/= 1 cm: 0 Number of spongiform nodules >/=  2 cm not described below (TR1): 0 Number of mixed cystic and solid nodules >/= 1.5 cm not described below (TR2): 0 _________________________________________________________ Mildly enlarged and heterogeneous thyroid gland with background pseudo nodularity throughout compatible with chronic medical thyroid disease. Normal vascularity. No discrete nodule or focal abnormality. No regional adenopathy. IMPRESSION: Mildly enlarged heterogeneous thyroid gland consistent with medical thyroid disease. No other significant finding by ultrasound. The above is in keeping with the ACR TI-RADS recommendations - J Am Coll Radiol 2017;14:587-595. Electronically Signed   By: Judie Petit.  Shick M.D.   On: 09/17/2021 11:57    Past Medical/Family/Surgical/Social History: Medications & Allergies reviewed per EMR, new medications updated. Patient Active Problem List   Diagnosis Date Noted   Prolapse of female pelvic organs 02/23/2018   Vaginal delivery 01/27/2018   Bipolar 1 disorder (HCC) 07/05/2014   Chronic constipation 11/11/2011   Ovarian cyst    Past Medical History:  Diagnosis Date   Bipolar 1 disorder (HCC)    Constipation    Enteritis    No pertinent past medical history    Ovarian cyst    Family History  Problem Relation Age of Onset   Asthma Brother    Hypertension Mother    Diabetes Paternal Uncle    Diabetes Maternal Grandmother    Cancer Maternal Grandmother    Diabetes Maternal Grandfather    Cancer Maternal Grandfather    Stroke Maternal Grandfather    Hirschsprung's  disease Neg Hx    Past Surgical History:  Procedure Laterality Date   TONSILLECTOMY     Social History   Occupational History   Not on file  Tobacco Use   Smoking status: Never   Smokeless tobacco: Never  Vaping Use   Vaping Use: Never used  Substance and Sexual Activity   Alcohol use: No   Drug use: No   Sexual activity: Not Currently    Birth control/protection: None

## 2021-09-22 LAB — HEMOGLOBIN A1C
Est. average glucose Bld gHb Est-mCnc: 111 mg/dL
Hgb A1c MFr Bld: 5.5 % (ref 4.8–5.6)

## 2021-09-22 LAB — THYROID PEROXIDASE ANTIBODY: Thyroperoxidase Ab SerPl-aCnc: >600 IU/mL — ABNORMAL HIGH (ref 0–34)

## 2021-09-22 LAB — COMPREHENSIVE METABOLIC PANEL
ALT: 9 IU/L (ref 0–32)
AST: 14 IU/L (ref 0–40)
Albumin/Globulin Ratio: 1.8 (ref 1.2–2.2)
Albumin: 4.7 g/dL (ref 4.0–5.0)
Alkaline Phosphatase: 73 IU/L (ref 44–121)
BUN/Creatinine Ratio: 11 (ref 9–23)
BUN: 9 mg/dL (ref 6–20)
Bilirubin Total: 0.4 mg/dL (ref 0.0–1.2)
CO2: 21 mmol/L (ref 20–29)
Calcium: 9.5 mg/dL (ref 8.7–10.2)
Chloride: 105 mmol/L (ref 96–106)
Creatinine, Ser: 0.8 mg/dL (ref 0.57–1.00)
Globulin, Total: 2.6 g/dL (ref 1.5–4.5)
Glucose: 88 mg/dL (ref 70–99)
Potassium: 5 mmol/L (ref 3.5–5.2)
Sodium: 140 mmol/L (ref 134–144)
Total Protein: 7.3 g/dL (ref 6.0–8.5)
eGFR: 104 mL/min/{1.73_m2} (ref >59)

## 2021-09-22 LAB — CBC
Hematocrit: 40.5 % (ref 34.0–46.6)
Hemoglobin: 13.5 g/dL (ref 11.1–15.9)
MCH: 29.7 pg (ref 26.6–33.0)
MCHC: 33.3 g/dL (ref 31.5–35.7)
MCV: 89 fL (ref 79–97)
Platelets: 325 10*3/uL (ref 150–450)
RBC: 4.54 x10E6/uL (ref 3.77–5.28)
RDW: 11.9 % (ref 11.7–15.4)
WBC: 6.7 10*3/uL (ref 3.4–10.8)

## 2021-09-22 LAB — TSH+FREE T4
Free T4: 1.1 ng/dL (ref 0.82–1.77)
TSH: 8.13 u[IU]/mL — ABNORMAL HIGH (ref 0.450–4.500)

## 2021-09-22 LAB — LIPID PANEL
Chol/HDL Ratio: 2 ratio (ref 0.0–4.4)
Cholesterol, Total: 130 mg/dL (ref 100–199)
HDL: 64 mg/dL (ref >39)
LDL Chol Calc (NIH): 54 mg/dL (ref 0–99)
Triglycerides: 51 mg/dL (ref 0–149)
VLDL Cholesterol Cal: 12 mg/dL (ref 5–40)

## 2021-09-22 LAB — THYROGLOBULIN LEVEL: Thyroglobulin (TG-RIA): 23 ng/mL

## 2021-09-22 LAB — VITAMIN D 25 HYDROXY (VIT D DEFICIENCY, FRACTURES): Vit D, 25-Hydroxy: 20.2 ng/mL — ABNORMAL LOW (ref 30.0–100.0)

## 2021-09-24 ENCOUNTER — Encounter: Payer: Self-pay | Admitting: Nurse Practitioner

## 2021-09-25 NOTE — Progress Notes (Signed)
She has been started on drisdol 50000 iu weekly.

## 2021-09-29 DIAGNOSIS — R1319 Other dysphagia: Secondary | ICD-10-CM | POA: Insufficient documentation

## 2021-09-29 DIAGNOSIS — M25531 Pain in right wrist: Secondary | ICD-10-CM | POA: Insufficient documentation

## 2021-09-29 DIAGNOSIS — E049 Nontoxic goiter, unspecified: Secondary | ICD-10-CM | POA: Insufficient documentation

## 2021-09-29 DIAGNOSIS — E559 Vitamin D deficiency, unspecified: Secondary | ICD-10-CM | POA: Insufficient documentation

## 2021-10-06 NOTE — Progress Notes (Signed)
Established patient visit   Patient: Kimberly [name]   DOB: Sep 25, 1995   26 y.o. Female  MRN: [id number] Visit Date: 10/07/2021   Chief Complaint  Patient presents with   Follow-up   Subjective    HPI  Follow up. -recently diagnosed with Hashimoto's thyroiditis  -started on levothyrolxine 25 mcg daily  -thyroid ultrasound showed mildly enlarged heterogeneous thyroid gland consistent with medical thyroid disease. No other significant finding by ultrasound.  -feet are feeling warmer.  -referred to orthopedics for bilateral wrist pain  -orthopedics believes she may have some nerve damage in her hands and wrists due to Hashimoto's thyroiditis. Was started on gabapentin.  -gabapentin does make her feel sleepy but does feel like it is helping with the pain she was having.  -mild constipation.    Medications: Outpatient Medications Prior to Visit  Medication Sig   ergocalciferol (DRISDOL) 1.25 MG (50000 UT) capsule Take 1 capsule (50,000 Units total) by mouth once a week.   gabapentin (NEURONTIN) 100 MG capsule Take 1 capsule (100 mg total) by mouth 2 (two) times daily.   levothyroxine (SYNTHROID) 25 MCG tablet Take 1 tablet (25 mcg total) by mouth daily.   Prenatal Vit-Fe Fumarate-FA (PRENATAL MULTIVITAMIN) TABS tablet Take 1 tablet by mouth daily at 12 noon.   pyridOXINE (VITAMIN B6) 100 MG tablet Take 1 tablet (100 mg total) by mouth daily.   No facility-administered medications prior to visit.    Review of Systems  Constitutional:  Positive for fatigue. Negative for activity change, appetite change, chills and fever.  HENT:  Negative for congestion, postnasal drip, rhinorrhea, sinus pressure, sinus pain, sneezing and sore throat.   Eyes: Negative.   Respiratory:  Negative for cough, chest tightness, shortness of breath and wheezing.   Cardiovascular:  Negative for chest pain and palpitations.       Extremities feeling warmer   Gastrointestinal:  Negative for abdominal pain,  constipation, diarrhea, nausea and vomiting.  Endocrine: Negative for cold intolerance, heat intolerance, polydipsia and polyuria.  Genitourinary:  Negative for dyspareunia, dysuria, flank pain, frequency and urgency.  Musculoskeletal:  Negative for arthralgias, back pain and myalgias.  Skin:  Negative for rash.  Allergic/Immunologic: Negative for environmental allergies.  Neurological:  Negative for dizziness, weakness and headaches.  Hematological:  Negative for adenopathy.  Psychiatric/Behavioral:  The patient is nervous/anxious.     Last CBC Lab Results  Component Value Date   WBC 6.7 09/16/2021   HGB 13.5 09/16/2021   HCT 40.5 09/16/2021   MCV 89 09/16/2021   MCH 29.7 09/16/2021   RDW 11.9 09/16/2021   PLT 325 09/16/2021   Last metabolic panel Lab Results  Component Value Date   GLUCOSE 88 09/16/2021   NA 140 09/16/2021   K 5.0 09/16/2021   CL 105 09/16/2021   CO2 21 09/16/2021   BUN 9 09/16/2021   CREATININE 0.80 09/16/2021   EGFR 104 09/16/2021   CALCIUM 9.5 09/16/2021   PROT 7.3 09/16/2021   ALBUMIN 4.7 09/16/2021   LABGLOB 2.6 09/16/2021   AGRATIO 1.8 09/16/2021   BILITOT 0.4 09/16/2021   ALKPHOS 73 09/16/2021   AST 14 09/16/2021   ALT 9 09/16/2021   ANIONGAP 8 07/03/2017   Last lipids Lab Results  Component Value Date   CHOL 130 09/16/2021   HDL 64 09/16/2021   LDLCALC 54 09/16/2021   TRIG 51 09/16/2021   CHOLHDL 2.0 09/16/2021   Last hemoglobin A1c Lab Results  Component Value Date   HGBA1C 5.5 09/16/2021  Last thyroid functions Lab Results  Component Value Date   TSH 8.130 (H) 09/16/2021   Last vitamin D Lab Results  Component Value Date   VD25OH 20.2 (L) 09/16/2021       Objective     Today's Vitals   10/07/21 1042  BP: 102/68  Pulse: 78  SpO2: 98%  Weight: 148 lb 12.8 oz (67.5 kg)  Height: $Remove'5\' 5"'ISyMbaD$  (1.651 m)   Body mass index is 24.76 kg/m.   BP Readings from Last 3 Encounters:  10/07/21 102/68  09/18/21 110/75   09/16/21 112/71    Wt Readings from Last 3 Encounters:  10/07/21 148 lb 12.8 oz (67.5 kg)  09/18/21 150 lb (68 kg)  09/16/21 152 lb (68.9 kg)    Physical Exam Vitals and nursing note reviewed.  Constitutional:      Appearance: Normal appearance. She is well-developed.  HENT:     Head: Normocephalic and atraumatic.     Nose: Nose normal.     Mouth/Throat:     Mouth: Mucous membranes are moist.     Pharynx: Oropharynx is clear.  Eyes:     Extraocular Movements: Extraocular movements intact.     Conjunctiva/sclera: Conjunctivae normal.     Pupils: Pupils are equal, round, and reactive to light.  Cardiovascular:     Rate and Rhythm: Normal rate and regular rhythm.     Pulses: Normal pulses.     Heart sounds: Normal heart sounds.  Pulmonary:     Effort: Pulmonary effort is normal.     Breath sounds: Normal breath sounds.  Abdominal:     Palpations: Abdomen is soft.  Musculoskeletal:        General: Normal range of motion.     Cervical back: Normal range of motion and neck supple.  Lymphadenopathy:     Cervical: No cervical adenopathy.  Skin:    General: Skin is warm and dry.     Capillary Refill: Capillary refill takes less than 2 seconds.  Neurological:     General: No focal deficit present.     Mental Status: She is alert and oriented to person, place, and time.  Psychiatric:        Mood and Affect: Mood normal.        Behavior: Behavior normal.        Thought Content: Thought content normal.        Judgment: Judgment normal.      Assessment & Plan    1. Hashimoto's thyroiditis Recent labs indicative of Hashimoto's thyroiditis.  Ultrasound of the thyroid indicates general thyromegaly without suspicious mass.  Patient has been started on levothyroxine 25 mcg daily.  Recheck TSH, T3, and free T4 in 2 months and adjust medication as indicated.  2. Bilateral wrist pain Patient now seeing orthopedics.  Has been started on gabapentin which does make her sleepy.  Takes  at night.  We will follow-up with orthopedics.  3. Chronic constipation Encouraged her to increase water and fiber in the diet.  Consider gentle laxative such as Dulcolax or MiraLAX as needed.   Problem List Items Addressed This Visit       Digestive   Chronic constipation     Endocrine   Hashimoto's thyroiditis - Primary     Other   Bilateral wrist pain     Return in about 2 months (around 12/07/2021) for thyroid - check TSH, Free T4, and T3 a week prior to visit .  Ronnell Freshwater, NP  [facility] at [facility] [phone] (phone) [phone] (fax)  [place]

## 2021-10-07 ENCOUNTER — Encounter: Payer: Self-pay | Admitting: Nurse Practitioner

## 2021-10-07 ENCOUNTER — Ambulatory Visit (INDEPENDENT_AMBULATORY_CARE_PROVIDER_SITE_OTHER): Payer: Commercial Managed Care - PPO | Admitting: Nurse Practitioner

## 2021-10-07 VITALS — BP 102/68 | HR 78 | Ht 65.0 in | Wt 148.8 lb

## 2021-10-07 DIAGNOSIS — M25532 Pain in left wrist: Secondary | ICD-10-CM

## 2021-10-07 DIAGNOSIS — E063 Autoimmune thyroiditis: Secondary | ICD-10-CM

## 2021-10-07 DIAGNOSIS — M25531 Pain in right wrist: Secondary | ICD-10-CM

## 2021-10-07 DIAGNOSIS — K5909 Other constipation: Secondary | ICD-10-CM | POA: Diagnosis not present

## 2021-10-21 DIAGNOSIS — E063 Autoimmune thyroiditis: Secondary | ICD-10-CM | POA: Insufficient documentation

## 2021-10-28 ENCOUNTER — Encounter: Payer: Self-pay | Admitting: Nurse Practitioner

## 2021-10-31 NOTE — Telephone Encounter (Signed)
I don't believe I have this form. I have looked. I don't remember getting this one.

## 2021-10-31 NOTE — Telephone Encounter (Signed)
Patient called office to inform that she needs form completed bu tomorrow if possible, please advise, thanks!

## 2021-10-31 NOTE — Telephone Encounter (Signed)
Called pt to advised her the form is ready for pick up and that she would need to just measure the waist circumference.  Pt is headed to the office now.  FYI

## 2021-11-02 ENCOUNTER — Other Ambulatory Visit: Payer: Self-pay | Admitting: Nurse Practitioner

## 2021-11-02 DIAGNOSIS — E063 Autoimmune thyroiditis: Secondary | ICD-10-CM

## 2021-11-07 ENCOUNTER — Encounter: Payer: Self-pay | Admitting: Student

## 2021-11-07 ENCOUNTER — Ambulatory Visit (INDEPENDENT_AMBULATORY_CARE_PROVIDER_SITE_OTHER): Payer: Commercial Managed Care - PPO | Admitting: Student

## 2021-11-07 ENCOUNTER — Other Ambulatory Visit (HOSPITAL_COMMUNITY)
Admission: RE | Admit: 2021-11-07 | Discharge: 2021-11-07 | Disposition: A | Discharge status: Home / Self Care | Payer: Commercial Managed Care - PPO | Source: Ambulatory Visit | Attending: Student | Admitting: Student

## 2021-11-07 VITALS — BP 112/70 | HR 86 | Ht 65.0 in | Wt 150.2 lb

## 2021-11-07 DIAGNOSIS — Z124 Encounter for screening for malignant neoplasm of cervix: Secondary | ICD-10-CM | POA: Diagnosis not present

## 2021-11-07 DIAGNOSIS — Z01419 Encounter for gynecological examination (general) (routine) without abnormal findings: Secondary | ICD-10-CM

## 2021-11-07 NOTE — Progress Notes (Signed)
Pt presents for annual exam. Pt denies any changes in her breast. Denies any abnormal vaginal discharge, odor, pain. No other concerns at this time.

## 2021-11-07 NOTE — Progress Notes (Signed)
ANNUAL EXAM Patient name: Kimberly [name] MRN [id number]  Date of birth: 1995-04-13 Chief Complaint:   Annual Exam  History of Present Illness:   Kimberly [name] is a 26 y.o. G74P2002 Caucasian female being seen today for a routine annual exam.  Current complaints, none. Patient would like to discuss what is normal vs. abnormal for menstrual cycle.  Patient's last menstrual period was 10/22/2021 (exact date). Periods are regular and light. Menses last for 3 days.    Upstream - 11/07/21 1009       Pregnancy Intention Screening   Does the patient want to become pregnant in the next year? No    Does the patient's partner want to become pregnant in the next year? N/A    Would the patient like to discuss contraceptive options today? No      Contraception Wrap Up   Current Method No Contraceptive Precautions            The pregnancy intention screening data noted above was reviewed. Potential methods of contraception were discussed. The patient elected to proceed with coitus interruptus.   Declines STI testing.   Last pap 2019. Results were: NILM w/ HRHPV not done. H/O abnormal pap: no Last mammogram: n/a. Results were: N/A. Family h/o breast cancer: no Last colonoscopy: n/a. Results were: N/A. Family h/o colorectal cancer: no     10/07/2021   10:44 AM 09/16/2021   10:09 AM 01/21/2018    2:48 PM 01/14/2018    3:07 PM 01/07/2018    9:09 AM  Depression screen PHQ 2/9  Decreased Interest 0 0 0 0 0  Down, Depressed, Hopeless 0 0 0 0 0  PHQ - 2 Score 0 0 0 0 0  Altered sleeping 1 1 0 0 0  Tired, decreased energy 0 1 0 0 0  Change in appetite 0 0 0 0 0  Feeling bad or failure about yourself  0 0 0 0 0  Trouble concentrating 0 0 0 0 0  Moving slowly or fidgety/restless 0 0 0 0 0  Suicidal thoughts 0 0 0 0 0  PHQ-9 Score 1 2 0 0 0        10/07/2021   10:44 AM 09/16/2021   10:09 AM 01/21/2018    2:48 PM 01/14/2018    3:07 PM  GAD 7 : Generalized Anxiety Score  Nervous, Anxious, on  Edge 0 0 0 0  Control/stop worrying 0 0 0 0  Worry too much - different things 0 0 0 0  Trouble relaxing 0 1 0 0  Restless 0 0 0 0  Easily annoyed or irritable 0 0 0 0  Afraid - awful might happen 0 0 0 0  Total GAD 7 Score 0 1 0 0     Review of Systems:   Pertinent items are noted in HPI Denies any headaches, blurred vision, fatigue, shortness of breath, chest pain, abdominal pain, abnormal vaginal discharge/itching/odor/irritation, problems with periods, bowel movements, urination, or intercourse unless otherwise stated above. Pertinent History Reviewed:  Reviewed past medical,surgical, social and family history.  Reviewed problem list, medications and allergies. Physical Assessment:   Vitals:   11/07/21 1005  BP: 112/70  Pulse: 86  Weight: 150 lb 3.2 oz (68.1 kg)  Height: 5\' 5"  (1.651 m)  Body mass index is 24.99 kg/m.        Physical Examination:   General appearance - well appearing, and in no distress  Mental status - alert, oriented to person, place, and  time  Psych:  She has a normal mood and affect  Skin - warm and dry, normal color, no suspicious lesions noted  Chest - effort normal, all lung fields clear to auscultation bilaterally  Heart - normal rate and regular rhythm  Neck:  midline trachea, no thyromegaly or nodules  Breasts - breasts appear normal, no suspicious masses, no skin or nipple changes or  axillary nodes  Abdomen - soft, nontender, nondistended, no masses or organomegaly  Pelvic - VULVA: normal appearing vulva with no masses, tenderness or lesions     VAGINA: normal appearing vagina with normal color and discharge, no lesions   CERVIX: friable cervix with white,milky discharge. No lesions, no CMT.   Thin prep pap is done    UTERUS: uterus is felt to be normal size, shape, consistency and nontender    ADNEXA: No adnexal masses or tenderness noted.  Rectal - normal rectal, good sphincter tone, no masses felt.   Extremities:  No swelling or  varicosities noted  Chaperone present for exam  No results found for this or any previous visit (from the past 24 hour(s)).  Assessment & Plan:  1. Encounter for well woman exam with routine gynecological exam - Normal exam - Discussed irregularities with menstrual cycle that are common/normal and symptoms that would warrant follow-up care. In the setting of Hashimoto's thyroiditis and recent change in treatment plan, discussed that she may notice changes in her cycle in the next few months. Instructed to return if no period for 3 months or if cycles become too heavy or continue to be irregular for 6-56months. Discussed different hormonal therapies that are available as well to manage menstrual cycles.  - Cytology - PAP  2. Encounter for Papanicolaou smear for cervical cancer screening - Patient with autoimmune disorder. Acknowledged implication of chronic inflammation on increased cancer risks, but provided reassurance to patient that there is not a strong correlation of her disorder with increased risk of cervical cancer. Should her prognosis change, screening measures will be adjusted accordingly.  - Cytology - PAP  Labs/procedures today: Pap  Mammogram: @ 26yo, or sooner if problems Colonoscopy: @ 26yo, or sooner if problems   Meds: None  Follow-up: No follow-ups on file.  Corlis Hove, NP 11/07/2021 4:18 PM

## 2021-11-11 LAB — CYTOLOGY - PAP: Diagnosis: NEGATIVE

## 2021-11-26 ENCOUNTER — Other Ambulatory Visit: Payer: Self-pay

## 2021-11-26 DIAGNOSIS — E063 Autoimmune thyroiditis: Secondary | ICD-10-CM

## 2021-12-02 ENCOUNTER — Other Ambulatory Visit: Payer: Commercial Managed Care - PPO

## 2021-12-02 DIAGNOSIS — E063 Autoimmune thyroiditis: Secondary | ICD-10-CM

## 2021-12-03 LAB — TSH: TSH: 5.87 u[IU]/mL — ABNORMAL HIGH (ref 0.450–4.500)

## 2021-12-03 LAB — T4, FREE: Free T4: 1.35 ng/dL (ref 0.82–1.77)

## 2021-12-03 LAB — T3: T3, Total: 139 ng/dL (ref 71–180)

## 2021-12-03 NOTE — Progress Notes (Signed)
Thyroid panel looks good. Discuss with patient at visit 12/09/2021

## 2021-12-08 NOTE — Progress Notes (Unsigned)
Established patient visit   Patient: Kimberly [name]   DOB: Oct 15, 1995   26 y.o. Female  MRN: [id number] Visit Date: 12/09/2021   No chief complaint on file.  Subjective    HPI  Follow up  -hypothyroid --started levothyroxine 25 mcg  ---TSH now improved but still elevated.  ---T3 and free  T4 now normal    Medications: Outpatient Medications Prior to Visit  Medication Sig   ergocalciferol (DRISDOL) 1.25 MG (50000 UT) capsule Take 1 capsule (50,000 Units total) by mouth once a week.   gabapentin (NEURONTIN) 100 MG capsule Take 1 capsule (100 mg total) by mouth 2 (two) times daily.   levothyroxine (SYNTHROID) 25 MCG tablet TAKE 1 TABLET BY MOUTH EVERY DAY   Prenatal Vit-Fe Fumarate-FA (PRENATAL MULTIVITAMIN) TABS tablet Take 1 tablet by mouth daily at 12 noon.   No facility-administered medications prior to visit.    Review of Systems  {Labs (Optional):23779}   Objective    There were no vitals filed for this visit. There is no height or weight on file to calculate BMI.  BP Readings from Last 3 Encounters:  11/07/21 112/70  10/07/21 102/68  09/18/21 110/75    Wt Readings from Last 3 Encounters:  11/07/21 150 lb 3.2 oz (68.1 kg)  10/07/21 148 lb 12.8 oz (67.5 kg)  09/18/21 150 lb (68 kg)    Physical Exam  ***  No results found for any visits on 12/09/21.  Assessment & Plan     Problem List Items Addressed This Visit   None    No follow-ups on file.         Carlean Jews, NP  [facility] at [facility] [phone] (phone) [phone] (fax)  [facility]

## 2021-12-09 ENCOUNTER — Encounter: Payer: Self-pay | Admitting: Nurse Practitioner

## 2021-12-09 ENCOUNTER — Ambulatory Visit (INDEPENDENT_AMBULATORY_CARE_PROVIDER_SITE_OTHER): Payer: Commercial Managed Care - PPO | Admitting: Nurse Practitioner

## 2021-12-09 VITALS — BP 104/68 | HR 74 | Resp 18 | Ht 65.0 in | Wt 154.0 lb

## 2021-12-09 DIAGNOSIS — E049 Nontoxic goiter, unspecified: Secondary | ICD-10-CM | POA: Diagnosis not present

## 2021-12-09 DIAGNOSIS — E559 Vitamin D deficiency, unspecified: Secondary | ICD-10-CM | POA: Diagnosis not present

## 2021-12-09 DIAGNOSIS — E063 Autoimmune thyroiditis: Secondary | ICD-10-CM | POA: Diagnosis not present

## 2021-12-09 MED ORDER — LEVOTHYROXINE SODIUM 50 MCG PO TABS: 50.0000 ug | ORAL_TABLET | Freq: Every day | ORAL | 2 refills | Status: DC

## 2021-12-09 MED ORDER — ERGOCALCIFEROL 1.25 MG (50000 UT) PO CAPS: 50000.0000 [IU] | ORAL_CAPSULE | ORAL | 5 refills | Status: AC

## 2022-01-31 ENCOUNTER — Other Ambulatory Visit: Payer: Self-pay | Admitting: Nurse Practitioner

## 2022-01-31 DIAGNOSIS — E063 Autoimmune thyroiditis: Secondary | ICD-10-CM

## 2022-02-03 ENCOUNTER — Other Ambulatory Visit: Payer: Self-pay | Admitting: Nurse Practitioner

## 2022-02-03 DIAGNOSIS — E063 Autoimmune thyroiditis: Secondary | ICD-10-CM

## 2022-02-28 ENCOUNTER — Other Ambulatory Visit: Payer: Self-pay | Admitting: Nurse Practitioner

## 2022-02-28 DIAGNOSIS — E063 Autoimmune thyroiditis: Secondary | ICD-10-CM

## 2022-03-04 ENCOUNTER — Other Ambulatory Visit: Payer: Self-pay

## 2022-03-04 DIAGNOSIS — E063 Autoimmune thyroiditis: Secondary | ICD-10-CM

## 2022-03-05 ENCOUNTER — Other Ambulatory Visit: Payer: Commercial Managed Care - PPO

## 2022-03-05 DIAGNOSIS — E063 Autoimmune thyroiditis: Secondary | ICD-10-CM

## 2022-03-06 LAB — T4: T4, Total: 8.7 ug/dL (ref 4.5–12.0)

## 2022-03-06 LAB — T3, FREE: T3, Free: 3.1 pg/mL (ref 2.0–4.4)

## 2022-03-06 LAB — TSH: TSH: 4.11 u[IU]/mL (ref 0.450–4.500)

## 2022-03-09 NOTE — Progress Notes (Unsigned)
Established patient visit   Patient: Kimberly [name]   DOB: [birth year]   27 y.o. Female  MRN: [id number] Visit Date: 03/10/2022   No chief complaint on file.  Subjective    HPI  Follow up  -hypothyroid --currently on synthroid 50 mcg daily  --recent thyroid panel normal.     Medications: Outpatient Medications Prior to Visit  Medication Sig   ergocalciferol (DRISDOL) 1.25 MG (50000 UT) capsule Take 1 capsule (50,000 Units total) by mouth once a week.   gabapentin (NEURONTIN) 100 MG capsule Take 1 capsule (100 mg total) by mouth 2 (two) times daily.   levothyroxine (SYNTHROID) 50 MCG tablet TAKE 1 TABLET BY MOUTH EVERY DAY   Prenatal Vit-Fe Fumarate-FA (PRENATAL MULTIVITAMIN) TABS tablet Take 1 tablet by mouth daily at 12 noon.   No facility-administered medications prior to visit.    Review of Systems  Last CBC Lab Results  Component Value Date   WBC 6.7 09/16/2021   HGB 13.5 09/16/2021   HCT 40.5 09/16/2021   MCV 89 09/16/2021   MCH 29.7 09/16/2021   RDW 11.9 09/16/2021   PLT 325 0000000   Last metabolic panel Lab Results  Component Value Date   GLUCOSE 88 09/16/2021   NA 140 09/16/2021   K 5.0 09/16/2021   CL 105 09/16/2021   CO2 21 09/16/2021   BUN 9 09/16/2021   CREATININE 0.80 09/16/2021   EGFR 104 09/16/2021   CALCIUM 9.5 09/16/2021   PROT 7.3 09/16/2021   ALBUMIN 4.7 09/16/2021   LABGLOB 2.6 09/16/2021   AGRATIO 1.8 09/16/2021   BILITOT 0.4 09/16/2021   ALKPHOS 73 09/16/2021   AST 14 09/16/2021   ALT 9 09/16/2021   ANIONGAP 8 07/03/2017   Last lipids Lab Results  Component Value Date   CHOL 130 09/16/2021   HDL 64 09/16/2021   LDLCALC 54 09/16/2021   TRIG 51 09/16/2021   CHOLHDL 2.0 09/16/2021   Last hemoglobin A1c Lab Results  Component Value Date   HGBA1C 5.5 09/16/2021   Last thyroid functions Lab Results  Component Value Date   TSH 4.110 03/05/2022   T3TOTAL 139 12/02/2021   T4TOTAL 8.7 03/05/2022   Last vitamin D Lab  Results  Component Value Date   VD25OH 20.2 (L) 09/16/2021       Objective    There were no vitals filed for this visit. There is no height or weight on file to calculate BMI.  BP Readings from Last 3 Encounters:  12/09/21 104/68  11/07/21 112/70  10/07/21 102/68    Wt Readings from Last 3 Encounters:  12/09/21 154 lb (69.9 kg)  11/07/21 150 lb 3.2 oz (68.1 kg)  10/07/21 148 lb 12.8 oz (67.5 kg)    Physical Exam  ***  No results found for any visits on 03/10/22.  Assessment & Plan     Problem List Items Addressed This Visit   None    Return in about 6 months (around 09/10/2022) for hypothryoid .         Ronnell Freshwater, NP  [facility] at [facility] [phone] (phone) [phone] (fax)  [place]

## 2022-03-10 ENCOUNTER — Encounter: Payer: Self-pay | Admitting: Nurse Practitioner

## 2022-03-10 ENCOUNTER — Ambulatory Visit (INDEPENDENT_AMBULATORY_CARE_PROVIDER_SITE_OTHER): Payer: Commercial Managed Care - PPO | Admitting: Nurse Practitioner

## 2022-03-10 VITALS — BP 99/68 | HR 90 | Ht 65.0 in | Wt 151.4 lb

## 2022-03-10 DIAGNOSIS — R21 Rash and other nonspecific skin eruption: Secondary | ICD-10-CM

## 2022-03-10 DIAGNOSIS — E063 Autoimmune thyroiditis: Secondary | ICD-10-CM

## 2022-03-10 MED ORDER — LEVOTHYROXINE SODIUM 50 MCG PO TABS: 50.0000 ug | ORAL_TABLET | Freq: Every day | ORAL | 1 refills | Status: AC

## 2022-03-12 NOTE — Progress Notes (Signed)
Waiting on results of sed rate. Other labs are normal

## 2022-03-14 ENCOUNTER — Encounter: Payer: Self-pay | Admitting: Nurse Practitioner

## 2022-03-18 ENCOUNTER — Other Ambulatory Visit: Payer: Self-pay | Admitting: Nurse Practitioner

## 2022-03-18 DIAGNOSIS — R21 Rash and other nonspecific skin eruption: Secondary | ICD-10-CM

## 2022-03-18 DIAGNOSIS — L209 Atopic dermatitis, unspecified: Secondary | ICD-10-CM

## 2022-03-18 LAB — CBC
Hematocrit: 39 % (ref 34.0–46.6)
Hemoglobin: 12.9 g/dL (ref 11.1–15.9)
MCH: 29.1 pg (ref 26.6–33.0)
MCHC: 33.1 g/dL (ref 31.5–35.7)
MCV: 88 fL (ref 79–97)
Platelets: 358 10*3/uL (ref 150–450)
RBC: 4.44 x10E6/uL (ref 3.77–5.28)
RDW: 11.7 % (ref 11.7–15.4)
WBC: 7.4 10*3/uL (ref 3.4–10.8)

## 2022-03-18 LAB — ANA W/REFLEX IF POSITIVE: Anti Nuclear Antibody (ANA): NEGATIVE

## 2022-03-18 LAB — SEDIMENTATION RATE

## 2022-03-18 MED ORDER — EUCRISA 2 % EX OINT: TOPICAL_OINTMENT | CUTANEOUS | 1 refills | Status: AC

## 2022-03-31 ENCOUNTER — Other Ambulatory Visit: Payer: Self-pay | Admitting: Nurse Practitioner

## 2022-03-31 DIAGNOSIS — L209 Atopic dermatitis, unspecified: Secondary | ICD-10-CM

## 2022-03-31 DIAGNOSIS — R21 Rash and other nonspecific skin eruption: Secondary | ICD-10-CM

## 2022-03-31 MED ORDER — TACROLIMUS 0.03 % EX OINT: TOPICAL_OINTMENT | Freq: Two times a day (BID) | CUTANEOUS | 1 refills | Status: AC

## 2022-04-10 ENCOUNTER — Other Ambulatory Visit: Payer: Self-pay | Admitting: Nurse Practitioner

## 2022-04-10 DIAGNOSIS — R21 Rash and other nonspecific skin eruption: Secondary | ICD-10-CM

## 2022-04-10 DIAGNOSIS — L209 Atopic dermatitis, unspecified: Secondary | ICD-10-CM

## 2022-04-10 MED ORDER — METRONIDAZOLE 0.75 % EX GEL: Freq: Every day | CUTANEOUS | 1 refills | Status: AC | PRN

## 2022-05-14 ENCOUNTER — Emergency Department (HOSPITAL_COMMUNITY)
Admission: EM | Admit: 2022-05-14 | Discharge: 2022-05-14 | Disposition: A | Discharge status: Home / Self Care | Payer: Commercial Managed Care - PPO | Attending: Emergency Medicine | Admitting: Emergency Medicine

## 2022-05-14 ENCOUNTER — Other Ambulatory Visit: Payer: Self-pay

## 2022-05-14 ENCOUNTER — Encounter: Payer: Self-pay | Admitting: Nurse Practitioner

## 2022-05-14 ENCOUNTER — Encounter (HOSPITAL_COMMUNITY): Payer: Self-pay | Admitting: Pharmacy Technician

## 2022-05-14 DIAGNOSIS — N3000 Acute cystitis without hematuria: Secondary | ICD-10-CM | POA: Diagnosis not present

## 2022-05-14 DIAGNOSIS — N819 Female genital prolapse, unspecified: Secondary | ICD-10-CM | POA: Insufficient documentation

## 2022-05-14 DIAGNOSIS — N8110 Cystocele, unspecified: Secondary | ICD-10-CM

## 2022-05-14 DIAGNOSIS — R102 Pelvic and perineal pain: Secondary | ICD-10-CM | POA: Diagnosis present

## 2022-05-14 DIAGNOSIS — M545 Low back pain, unspecified: Secondary | ICD-10-CM | POA: Diagnosis not present

## 2022-05-14 LAB — URINALYSIS, ROUTINE W REFLEX MICROSCOPIC
Bilirubin Urine: NEGATIVE
Glucose, UA: NEGATIVE mg/dL
Hgb urine dipstick: NEGATIVE
Ketones, ur: NEGATIVE mg/dL
Nitrite: NEGATIVE
Protein, ur: NEGATIVE mg/dL
Specific Gravity, Urine: 1.006 (ref 1.005–1.030)
pH: 6 (ref 5.0–8.0)

## 2022-05-14 MED ORDER — CEPHALEXIN 250 MG/5ML PO SUSR: 500.0000 mg | Freq: Two times a day (BID) | ORAL | 0 refills | Status: AC

## 2022-05-14 NOTE — ED Provider Notes (Signed)
[place] [facility]ENC[facility] DEPARTMENT AT [facility] Provider Note   CSN: [id number] Arrival date & time: 05/14/22  1651     History  Chief Complaint  Patient presents with   Back Pain    Kimberly [name] is a 27 y.o. female.  27 y.o female with no PMH presents to the ED with a chief complaint of bladder prolapse x yesterday. Patient reports this occurred after the birth of her second child in 2020, states she is concern as she noticed that "something doesn't feel right" down there. She is having more irritation around the area. She also endorses urinary frequency for the last couple of days. She reports pain along the pelvic area and her back. She called her OBGYN however they are close and cannot see her. No other complaints reported.   The history is provided by the patient.  Back Pain Associated symptoms: no abdominal pain, no chest pain, no dysuria and no fever        Home Medications Prior to Admission medications   Medication Sig Start Date End Date Taking? Authorizing Provider  cephALEXin (KEFLEX) 250 MG/5ML suspension Take 10 mLs (500 mg total) by mouth 2 (two) times daily for 7 days. 05/14/22 05/21/22 Yes Mollee Neer, PA-C  Crisaborole (EUCRISA) 2 % OINT Apply small amount to skin of the face twice daily as needed. 03/18/22   Carlean Jews, NP  metroNIDAZOLE (METROGEL) 0.75 % gel Apply topically daily as needed. 04/10/22   Carlean Jews, NP  ergocalciferol (DRISDOL) 1.25 MG (50000 UT) capsule Take 1 capsule (50,000 Units total) by mouth once a week. 12/09/21   Carlean Jews, NP  levothyroxine (SYNTHROID) 50 MCG tablet Take 1 tablet (50 mcg total) by mouth daily. 03/10/22   Carlean Jews, NP  Prenatal Vit-Fe Fumarate-FA (PRENATAL MULTIVITAMIN) TABS tablet Take 1 tablet by mouth daily at 12 noon.    [provider]  tacrolimus (PROTOPIC) 0.03 % ointment Apply topically 2 (two) times daily. 03/31/22   Carlean Jews, NP      Allergies    Patient has  no known allergies.    Review of Systems   Review of Systems  Constitutional:  Negative for chills and fever.  Respiratory:  Negative for shortness of breath.   Cardiovascular:  Negative for chest pain.  Gastrointestinal:  Negative for abdominal pain, diarrhea and vomiting.  Genitourinary:  Negative for dysuria and flank pain.  Musculoskeletal:  Positive for back pain.  All other systems reviewed and are negative.   Physical Exam Updated Vital Signs BP 110/81   Pulse 76   Temp 98.9 F (37.2 C) (Oral)   Resp 14   SpO2 100%  Physical Exam Vitals and nursing note reviewed. Exam conducted with a chaperone present.  Constitutional:      Appearance: Normal appearance.  HENT:     Head: Normocephalic and atraumatic.     Mouth/Throat:     Mouth: Mucous membranes are moist.  Cardiovascular:     Rate and Rhythm: Normal rate.  Pulmonary:     Effort: Pulmonary effort is normal.  Abdominal:     General: Abdomen is flat.  Genitourinary:    Labia:        Right: No tenderness.        Left: No tenderness.      Urethra: Prolapse present.     Cervix: Discharge present.     Rectum: Normal.     Comments: Chaperoned by [facility] NT Bladder prolapse noted,  green foul discharge noted.   Musculoskeletal:     Cervical back: Normal range of motion and neck supple.  Skin:    General: Skin is warm and dry.  Neurological:     Mental Status: She is alert and oriented to person, place, and time.     ED Results / Procedures / Treatments   Labs (all labs ordered are listed, but only abnormal results are displayed) Labs Reviewed  URINALYSIS, ROUTINE W REFLEX MICROSCOPIC - Abnormal; Notable for the following components:      Result Value   APPearance HAZY (*)    Leukocytes,Ua LARGE (*)    Bacteria, UA MANY (*)    All other components within normal limits    EKG None  Radiology No results found.  Procedures Procedures    Medications Ordered in ED Medications - No data to  display  ED Course/ Medical Decision Making/ A&P Clinical Course as of 05/14/22 2143  Wed May 14, 2022  2043 Leukocytes,Ua(!): LARGE [JS]  2043 WBC, UA: 11-20 [JS]  2141 Bacteria, UA(!): MANY [JS]    Clinical Course User Index [JS] Claude Manges, PA-C                             Medical Decision Making Amount and/or Complexity of Data Reviewed Labs: ordered. Decision-making details documented in ED Course.  Risk Prescription drug management.    Patient presents to the ED with a chief complaint of prolapse which she noticed yesterday.  Patient reports she had a similar thing happened in 2020 after the birth of her second child.  She reports urinary frequency, discomfort, burning along with lower pelvic pressure.  She did call her GYN but reported they could not see her for a couple of days.  She is having back pain as well with this.  She does have 2 kids at home, and did have vaginal deliveries for both of these.  UA with many bacteria, 11-20 white blood cell count, large leukocytes present.  In the setting of urinary symptoms, I feel that patient needs treatment for urinary tract infection, she will be placed on Keflex twice daily for 7 days, she is requesting liquid form at this time.  No CVA tenderness, did have some low back pain.  We did do a pelvic exam which that show some bladder prolapse noted however only when patient bears down, there is no reduction that needed to be performed.  Did discuss with her likely making her prolapse worse with the infection at this time.  She will need to follow-up with gynecology.  She is otherwise hemodynamically stable for discharge.  Portions of this note were generated with Scientist, clinical (histocompatibility and immunogenetics). Dictation errors may occur despite best attempts at proofreading.   Final Clinical Impression(s) / ED Diagnoses Final diagnoses:  Acute midline low back pain without sciatica  Acute cystitis without hematuria  Female bladder prolapse    Rx /  DC Orders ED Discharge Orders          Ordered    cephALEXin (KEFLEX) 250 MG/5ML suspension  2 times daily        05/14/22 2132              Claude Manges, PA-C 05/14/22 2143    Long, Arlyss Repress, MD 05/17/22 0111

## 2022-05-14 NOTE — ED Triage Notes (Signed)
Pt here with concern for bladder prolapse. Hx of same in 2020. Reports back pain and frequent urination along with "heavy feeling" in her groin.

## 2022-05-14 NOTE — Discharge Instructions (Addendum)
You were prescribed antibiotics to help treat your urinary tract infection. Please take this medication as prescribed.    You will need to schedule an appointment with your GYN in order to further address the prolapse.

## 2022-09-02 ENCOUNTER — Other Ambulatory Visit: Payer: Self-pay | Admitting: Family Medicine

## 2022-09-02 DIAGNOSIS — Z0000 Encounter for general adult medical examination without abnormal findings: Secondary | ICD-10-CM

## 2022-09-02 DIAGNOSIS — E063 Autoimmune thyroiditis: Secondary | ICD-10-CM

## 2022-09-03 ENCOUNTER — Other Ambulatory Visit: Payer: Commercial Managed Care - PPO

## 2022-09-10 ENCOUNTER — Ambulatory Visit: Payer: Commercial Managed Care - PPO | Admitting: Nurse Practitioner

## 2022-10-02 ENCOUNTER — Other Ambulatory Visit: Payer: Self-pay | Admitting: Nurse Practitioner

## 2022-10-02 DIAGNOSIS — E063 Autoimmune thyroiditis: Secondary | ICD-10-CM
